# Patient Record
Sex: Male | Born: 1985
Health system: Southern US, Community
[De-identification: ages and names within clinical notes are randomized; demographics above are authoritative.]

## PROBLEM LIST (undated history)

## (undated) DIAGNOSIS — G709 Myoneural disorder, unspecified: Secondary | ICD-10-CM

---

## 2016-03-13 ENCOUNTER — Encounter (HOSPITAL_COMMUNITY): Payer: Self-pay | Admitting: *Deleted

## 2016-03-13 ENCOUNTER — Emergency Department (HOSPITAL_COMMUNITY)
Admission: EM | Admit: 2016-03-13 | Discharge: 2016-03-13 | Disposition: A | Discharge status: Home / Self Care | Payer: BLUE CROSS/BLUE SHIELD | Attending: Emergency Medicine | Admitting: Emergency Medicine

## 2016-03-13 DIAGNOSIS — Y939 Activity, unspecified: Secondary | ICD-10-CM | POA: Diagnosis not present

## 2016-03-13 DIAGNOSIS — M5442 Lumbago with sciatica, left side: Secondary | ICD-10-CM | POA: Insufficient documentation

## 2016-03-13 DIAGNOSIS — Y929 Unspecified place or not applicable: Secondary | ICD-10-CM | POA: Diagnosis not present

## 2016-03-13 DIAGNOSIS — M545 Low back pain: Secondary | ICD-10-CM | POA: Diagnosis present

## 2016-03-13 DIAGNOSIS — Z79899 Other long term (current) drug therapy: Secondary | ICD-10-CM | POA: Insufficient documentation

## 2016-03-13 DIAGNOSIS — X500XXA Overexertion from strenuous movement or load, initial encounter: Secondary | ICD-10-CM | POA: Insufficient documentation

## 2016-03-13 DIAGNOSIS — M62830 Muscle spasm of back: Secondary | ICD-10-CM | POA: Diagnosis not present

## 2016-03-13 DIAGNOSIS — Y999 Unspecified external cause status: Secondary | ICD-10-CM | POA: Insufficient documentation

## 2016-03-13 DIAGNOSIS — M5432 Sciatica, left side: Secondary | ICD-10-CM

## 2016-03-13 MED ORDER — CYCLOBENZAPRINE HCL 10 MG PO TABS: 10.0000 mg | ORAL_TABLET | Freq: Three times a day (TID) | ORAL | 0 refills | Status: DC | PRN

## 2016-03-13 MED ORDER — NAPROXEN 500 MG PO TABS: 500.0000 mg | ORAL_TABLET | Freq: Two times a day (BID) | ORAL | 0 refills | Status: DC

## 2016-03-13 MED ORDER — PREDNISONE 20 MG PO TABS: ORAL_TABLET | ORAL | 0 refills | Status: DC

## 2016-03-13 MED ORDER — KETOROLAC TROMETHAMINE 30 MG/ML IJ SOLN
60.0000 mg | Freq: Once | INTRAMUSCULAR | Status: AC
Start: 2016-03-13 — End: 2016-03-13
  Administered 2016-03-13: 60 mg via INTRAMUSCULAR
  Filled 2016-03-13: qty 2

## 2016-03-13 NOTE — ED Triage Notes (Signed)
Pt rts left back and leg pain that has been ongoing for 2 weeks. Pt reports seeing PCP with prescription given and no relief.

## 2016-03-13 NOTE — ED Notes (Signed)
Pt standing in room, states to back to sit down.  Pt began experiencing lower left side back pain and "like I pulled a hamstring" approx 12/19.  Has been seeing PMD, given muscle relaxers and percocet for pain and numbness to two lateral left toes.  States he is not improving and is unable to sleep, percocet is not helping.  Denies any other complaints.

## 2016-03-13 NOTE — Discharge Instructions (Signed)
Back Pain: Your back pain should be treated with medicines such as ibuprofen or aleve and this back pain should get better over the next 2 weeks.  However if you develop severe or worsening pain, low back pain with fever, numbness, weakness or inability to walk or urinate, you should return to the ER immediately.  Please follow up with your doctor this week for a recheck if still having symptoms.  Avoid heavy lifting over 10 pounds over the next two weeks.  Low back pain is discomfort in the lower back that may be due to injuries to muscles and ligaments around the spine.  Occasionally, it may be caused by a a problem to a part of the spine called a disc.  The pain may last several days or a week;  However, most patients get completely well in 4 weeks.  Self - care:  The application of heat can help soothe the pain.  Maintaining your daily activities, including walking, is encourged, as it will help you get better faster than just staying in bed. Perform gentle stretching as discussed. Drink plenty of fluids.  Medications are also useful to help with pain control.  A commonly prescribed medication includes over the counter tylenol as directed on the bottle.  Non steroidal anti inflammatory medications including Ibuprofen and naproxen;  These medications help both pain and swelling and are very useful in treating back pain.  They should be taken with food, as they can cause stomach upset, and more seriously, stomach bleeding.    Muscle relaxants:  These medications can help with muscle tightness that is a cause of lower back pain.  Most of these medications can cause drowsiness, and it is not safe to drive or use dangerous machinery while taking them.  Prednisone: this should help with the tingling you have in your leg. Take as directed, with breakfast.  SEEK IMMEDIATE MEDICAL ATTENTION IF: New numbness, tingling, weakness, or problem with the use of your arms or legs.  Severe back pain not relieved  with medications.  Difficulty with or loss of control of your bowel or bladder control.  Increasing pain in any areas of the body (such as chest or abdominal pain).  Shortness of breath, dizziness or fainting.  Nausea (feeling sick to your stomach), vomiting, fever, or sweats.  You will need to follow up with  Your primary healthcare provider in 1-2 weeks for reassessment.

## 2016-03-13 NOTE — ED Provider Notes (Signed)
[facility]-EMERGENCY DEPT Provider Note   CSN: [id number] Arrival date & time: 03/13/16  1259    By signing my name below, I, Jacob Wong, attest that this documentation has been prepared under the direction and in the presence of Marcos Ruelas Camprubi-Soms, PA-C. Electronically Signed: Valentino Wong, ED Scribe. 03/13/16. 2:17 PM.  History   Chief Complaint Chief Complaint  Patient presents with  . Back Pain   The history is provided by the patient and the spouse. No language interpreter was used.  Back Pain   This is a recurrent problem. The current episode started more than 1 week ago. The problem occurs daily. The problem has not changed since onset.The pain is associated with lifting heavy objects. The pain is present in the lumbar spine. Quality: sore. The pain radiates to the left thigh and left foot. The pain is at a severity of 6/10. The pain is moderate. The symptoms are aggravated by certain positions. The pain is the same all the time. Associated symptoms include numbness (paresthesias L pinky toe/posterior thigh), paresthesias (L thigh/foot) and tingling. Pertinent negatives include no chest pain, no fever, no abdominal pain, no bowel incontinence, no perianal numbness, no bladder incontinence, no dysuria, no paresis and no weakness. He has tried muscle relaxants and analgesics (prednisone, percocet, and flexeril) for the symptoms. The treatment provided mild relief.   HPI Comments: Jacob Wong is a 31 y.o. male who presents to the Emergency Department complaining of ongoing L lower back pain x1 month. He describes his pain as 6/10, intermittent, sore L lower back pain radiating into posterior L thigh and L foot, worse with movement, and with mild improvement after course of prednisone and flexeril 5mg  as well as percocet use. Associated symptoms includes mild tingling/paresthesias in his left pinky toe. Pt denies recent trauma, injury, falls, twisting, but states he does heavy lifting  and repetitive bending at work. Denies h/o cancer, IVDU, back surgery. Pt was seen at his PCP's office at [facility] in [place], [place] for this, which is when they prescribed the flexeril and prednisone first, then at his follow up visit he was given the percocet. Has not tried anything else for his symptoms. He denies bowel or bladder incontinence, saddle anesthesia/cauda equina symptoms, fever, chills, CP, SOB, abd pain, n/v/d/c, dysuria, hematuria, frequency, complete numbness, focal weakness, rashes, myalgias, arthralgias, or any other complaints at this time.    History reviewed. No pertinent past medical history.  There are no active problems to display for this patient.   History reviewed. No pertinent surgical history.     Home Medications    Prior to Admission medications   Medication Sig Start Date End Date Taking? Authorizing Provider  cyclobenzaprine (FLEXERIL) 10 MG tablet Take 1 tablet (10 mg total) by mouth 3 (three) times daily as needed for muscle spasms. 03/13/16   Mantaj Chamberlin Camprubi-Soms, PA-C  naproxen (NAPROSYN) 500 MG tablet Take 1 tablet (500 mg total) by mouth 2 (two) times daily with a meal. 03/13/16   Loys Shugars Camprubi-Soms, PA-C  predniSONE (DELTASONE) 20 MG tablet 3 tabs po day one, then 2 tabs daily x 4 days 03/13/16   Yan Okray Camprubi-Soms, PA-C    Family History No family history on file.  Social History Social History  Substance Use Topics  . Smoking status: Not on file  . Smokeless tobacco: Not on file  . Alcohol use Not on file     Allergies   Patient has no allergy information on record.   Review of  Systems Review of Systems  Constitutional: Negative for chills and fever.  Respiratory: Negative for shortness of breath.   Cardiovascular: Negative for chest pain.  Gastrointestinal: Negative for abdominal pain, bowel incontinence, constipation, diarrhea, nausea and vomiting.  Genitourinary: Negative for bladder incontinence,  difficulty urinating (no incontinence), dysuria and hematuria.  Musculoskeletal: Positive for back pain. Negative for arthralgias and myalgias.  Skin: Negative for color change.  Allergic/Immunologic: Negative for immunocompromised state.  Neurological: Positive for tingling, numbness (paresthesias L pinky toe/posterior thigh) and paresthesias (L thigh/foot). Negative for weakness.  Psychiatric/Behavioral: Negative for confusion.    10 Systems reviewed and all are negative for acute change except as noted in the HPI.  Physical Exam Updated Vital Signs BP 129/83 (BP Location: Left Arm)   Pulse 91   Temp 98.4 F (36.9 C) (Oral)   Resp 18   SpO2 99%   Physical Exam  Constitutional: He is oriented to person, place, and time. Vital signs are normal. He appears well-developed and well-nourished.  Non-toxic appearance. No distress.  Afebrile, nontoxic, NAD  HENT:  Head: Normocephalic and atraumatic.  Mouth/Throat: Mucous membranes are normal.  Eyes: Conjunctivae and EOM are normal. Right eye exhibits no discharge. Left eye exhibits no discharge.  Neck: Normal range of motion. Neck supple.  Cardiovascular: Normal rate and intact distal pulses.   Pulmonary/Chest: Effort normal. No respiratory distress.  Abdominal: Normal appearance. He exhibits no distension.  Musculoskeletal: Normal range of motion.       Lumbar back: He exhibits tenderness and spasm. He exhibits normal range of motion, no bony tenderness and no deformity.       Back:  Lumbar spine with FROM intact without spinous process TTP, no bony stepoffs or deformities, with mild left-sided paraspinous muscle TTP and muscle spasms. Strength and sensation grossly intact in all extremities, +SLR on left side, gait steady and nonantalgic. No overlying skin changes. Distal pulses intact.  Neurological: He is alert and oriented to person, place, and time. He has normal strength. No sensory deficit.  Skin: Skin is warm, dry and intact. No  rash noted.  Psychiatric: He has a normal mood and affect.  Nursing note and vitals reviewed.    ED Treatments / Results   DIAGNOSTIC STUDIES: Oxygen Saturation is 99% on RA, normal by my interpretation.    COORDINATION OF CARE: 2:07 PM Discussed treatment plan with pt at bedside which includes steroids, antiinflammatory, muscle relaxant and f/u with PCP and pt agreed to plan.   Labs (all labs ordered are listed, but only abnormal results are displayed) Labs Reviewed - No data to display  EKG  EKG Interpretation None       Radiology No results found.  Procedures Procedures (including critical care time)  Medications Ordered in ED Medications  ketorolac (TORADOL) 30 MG/ML injection 60 mg (60 mg Intramuscular Given 03/13/16 1420)     Initial Impression / Assessment and Plan / ED Course  I have reviewed the triage vital signs and the nursing notes.  Pertinent labs & imaging results that were available during my care of the patient were reviewed by me and considered in my medical decision making (see chart for details).  Clinical Course     31 y.o. male here with L lower back pain x1 month, does a lot of heavy lifting at work. No specific known injury. No red flag s/s of low back pain, no midline tenderness, no urinary complaints. No s/s of central cord compression or cauda equina. Lower extremities are  neurovascularly intact and patient is ambulating without difficulty. +SLR on L side, likely sciatica; c/o tingling in L pinky toe and posterior thigh which is consistent. Tenderness to L lumbar paraspinous muscles and SI joint area. Doubt need for emergent imaging at this time, but pt may warrant outpatient MRI if symptoms persist. Given that he improved with prednisone previously, will repeat this course but advised that this is likely a temporary band-aid and that he may still need to f/up with PCP for ongoing management and further eval/outpatient MRI.  Patient was  counseled on back pain precautions and told to do activity as tolerated but do not lift, push, or pull heavy objects more than 10 pounds for the next week. Patient counseled to use ice or heat on back for no longer than 15 minutes every hour.   Rx given for muscle relaxer and counseled on proper use of muscle relaxant medication. Urged patient not to drink alcohol, drive, or perform any other activities that requires focus while taking these medications. Rx given for naprosyn and prednisone. Discussed use of tylenol as well.  Patient urged to follow-up with PCP if pain does not improve with treatment and rest or if pain becomes recurrent. Urged to return with worsening severe pain, loss of bowel or bladder control, trouble walking. The patient verbalizes understanding and agrees with the plan.   I personally performed the services described in this documentation, which was scribed in my presence. The recorded information has been reviewed and is accurate.   Final Clinical Impressions(s) / ED Diagnoses   Final diagnoses:  Acute left-sided low back pain with left-sided sciatica  Muscle spasm of back  Sciatica of left side    New Prescriptions New Prescriptions   CYCLOBENZAPRINE (FLEXERIL) 10 MG TABLET    Take 1 tablet (10 mg total) by mouth 3 (three) times daily as needed for muscle spasms.   NAPROXEN (NAPROSYN) 500 MG TABLET    Take 1 tablet (500 mg total) by mouth 2 (two) times daily with a meal.   PREDNISONE (DELTASONE) 20 MG TABLET    3 tabs po day one, then 2 tabs daily x 4 days     [employer], PA-C 03/13/16 1426    Donnetta Hutching, MD 03/15/16 [phone]

## 2019-06-02 ENCOUNTER — Inpatient Hospital Stay (HOSPITAL_COMMUNITY)
Admission: EM | Admit: 2019-06-02 | Discharge: 2019-06-06 | DRG: 060 | Disposition: A | Discharge status: Home / Self Care | Payer: BC Managed Care – PPO | Attending: Student in an Organized Health Care Education/Training Program | Admitting: Student in an Organized Health Care Education/Training Program

## 2019-06-02 ENCOUNTER — Encounter (HOSPITAL_COMMUNITY): Payer: Self-pay | Admitting: Student

## 2019-06-02 ENCOUNTER — Inpatient Hospital Stay (HOSPITAL_COMMUNITY): Payer: BC Managed Care – PPO

## 2019-06-02 ENCOUNTER — Emergency Department (HOSPITAL_COMMUNITY): Payer: BC Managed Care – PPO

## 2019-06-02 ENCOUNTER — Other Ambulatory Visit: Payer: Self-pay

## 2019-06-02 DIAGNOSIS — R05 Cough: Secondary | ICD-10-CM

## 2019-06-02 DIAGNOSIS — R739 Hyperglycemia, unspecified: Secondary | ICD-10-CM | POA: Diagnosis not present

## 2019-06-02 DIAGNOSIS — G35 Multiple sclerosis: Secondary | ICD-10-CM | POA: Diagnosis not present

## 2019-06-02 DIAGNOSIS — R059 Cough, unspecified: Secondary | ICD-10-CM

## 2019-06-02 DIAGNOSIS — E559 Vitamin D deficiency, unspecified: Secondary | ICD-10-CM | POA: Diagnosis present

## 2019-06-02 DIAGNOSIS — Y92230 Patient room in hospital as the place of occurrence of the external cause: Secondary | ICD-10-CM | POA: Diagnosis not present

## 2019-06-02 DIAGNOSIS — M5031 Other cervical disc degeneration,  high cervical region: Secondary | ICD-10-CM | POA: Diagnosis not present

## 2019-06-02 DIAGNOSIS — R202 Paresthesia of skin: Secondary | ICD-10-CM | POA: Diagnosis not present

## 2019-06-02 DIAGNOSIS — R29818 Other symptoms and signs involving the nervous system: Secondary | ICD-10-CM | POA: Diagnosis not present

## 2019-06-02 DIAGNOSIS — M5124 Other intervertebral disc displacement, thoracic region: Secondary | ICD-10-CM | POA: Diagnosis not present

## 2019-06-02 DIAGNOSIS — G379 Demyelinating disease of central nervous system, unspecified: Secondary | ICD-10-CM | POA: Diagnosis not present

## 2019-06-02 DIAGNOSIS — Z03818 Encounter for observation for suspected exposure to other biological agents ruled out: Secondary | ICD-10-CM | POA: Diagnosis not present

## 2019-06-02 DIAGNOSIS — Z79899 Other long term (current) drug therapy: Secondary | ICD-10-CM

## 2019-06-02 DIAGNOSIS — Z880 Allergy status to penicillin: Secondary | ICD-10-CM | POA: Diagnosis not present

## 2019-06-02 DIAGNOSIS — Z8249 Family history of ischemic heart disease and other diseases of the circulatory system: Secondary | ICD-10-CM

## 2019-06-02 DIAGNOSIS — T380X5A Adverse effect of glucocorticoids and synthetic analogues, initial encounter: Secondary | ICD-10-CM | POA: Diagnosis not present

## 2019-06-02 DIAGNOSIS — M79602 Pain in left arm: Secondary | ICD-10-CM | POA: Diagnosis not present

## 2019-06-02 DIAGNOSIS — Z20822 Contact with and (suspected) exposure to covid-19: Secondary | ICD-10-CM | POA: Diagnosis not present

## 2019-06-02 DIAGNOSIS — Z881 Allergy status to other antibiotic agents status: Secondary | ICD-10-CM

## 2019-06-02 DIAGNOSIS — K219 Gastro-esophageal reflux disease without esophagitis: Secondary | ICD-10-CM

## 2019-06-02 DIAGNOSIS — R200 Anesthesia of skin: Secondary | ICD-10-CM | POA: Diagnosis not present

## 2019-06-02 DIAGNOSIS — R519 Headache, unspecified: Secondary | ICD-10-CM | POA: Diagnosis not present

## 2019-06-02 LAB — BASIC METABOLIC PANEL
Anion gap: 9 (ref 5–15)
BUN: 11 mg/dL (ref 6–20)
CO2: 28 mmol/L (ref 22–32)
Calcium: 9.4 mg/dL (ref 8.9–10.3)
Chloride: 102 mmol/L (ref 98–111)
Creatinine, Ser: 0.85 mg/dL (ref 0.61–1.24)
GFR calc Af Amer: >60 mL/min (ref >60)
GFR calc non Af Amer: >60 mL/min (ref >60)
Glucose, Bld: 100 mg/dL — ABNORMAL HIGH (ref 70–99)
Potassium: 4 mmol/L (ref 3.5–5.1)
Sodium: 139 mmol/L (ref 135–145)

## 2019-06-02 LAB — CBC
HCT: 46.5 % (ref 39.0–52.0)
Hemoglobin: 15.2 g/dL (ref 13.0–17.0)
MCH: 31 pg (ref 26.0–34.0)
MCHC: 32.7 g/dL (ref 30.0–36.0)
MCV: 94.9 fL (ref 80.0–100.0)
Platelets: 216 10*3/uL (ref 150–400)
RBC: 4.9 MIL/uL (ref 4.22–5.81)
RDW: 11.9 % (ref 11.5–15.5)
WBC: 5.5 10*3/uL (ref 4.0–10.5)
nRBC: 0 % (ref 0.0–0.2)

## 2019-06-02 LAB — URINALYSIS, ROUTINE W REFLEX MICROSCOPIC
Bilirubin Urine: NEGATIVE
Glucose, UA: NEGATIVE mg/dL
Hgb urine dipstick: NEGATIVE
Ketones, ur: NEGATIVE mg/dL
Leukocytes,Ua: NEGATIVE
Nitrite: NEGATIVE
Protein, ur: NEGATIVE mg/dL
Specific Gravity, Urine: 1.01 (ref 1.005–1.030)
pH: 6 (ref 5.0–8.0)

## 2019-06-02 LAB — SARS CORONAVIRUS 2 (TAT 6-24 HRS): SARS Coronavirus 2: NEGATIVE

## 2019-06-02 LAB — HEMOGLOBIN A1C
Hgb A1c MFr Bld: 4.9 % (ref 4.8–5.6)
Mean Plasma Glucose: 93.93 mg/dL

## 2019-06-02 LAB — GLUCOSE, CAPILLARY: Glucose-Capillary: 160 mg/dL — ABNORMAL HIGH (ref 70–99)

## 2019-06-02 LAB — MAGNESIUM: Magnesium: 1.9 mg/dL (ref 1.7–2.4)

## 2019-06-02 MED ORDER — ACETAMINOPHEN 650 MG RE SUPP: 650.0000 mg | Freq: Four times a day (QID) | RECTAL | Status: DC | PRN

## 2019-06-02 MED ORDER — LIDOCAINE HCL (PF) 1 % IJ SOLN
INTRAMUSCULAR | Status: AC
  Filled 2019-06-02: qty 30

## 2019-06-02 MED ORDER — ACETAMINOPHEN 325 MG PO TABS
650.0000 mg | ORAL_TABLET | Freq: Four times a day (QID) | ORAL | Status: DC | PRN
  Administered 2019-06-03 – 2019-06-05 (×3): 650 mg via ORAL
  Filled 2019-06-02 (×3): qty 2

## 2019-06-02 MED ORDER — DIAZEPAM 5 MG PO TABS
5.0000 mg | ORAL_TABLET | Freq: Once | ORAL | Status: AC
  Administered 2019-06-02: 5 mg via ORAL
  Filled 2019-06-02: qty 1

## 2019-06-02 MED ORDER — INSULIN ASPART 100 UNIT/ML ~~LOC~~ SOLN
0.0000 [IU] | Freq: Three times a day (TID) | SUBCUTANEOUS | Status: DC
  Administered 2019-06-03: 2 [IU] via SUBCUTANEOUS
  Administered 2019-06-03: 1 [IU] via SUBCUTANEOUS
  Administered 2019-06-03 – 2019-06-04 (×3): 2 [IU] via SUBCUTANEOUS
  Administered 2019-06-04 – 2019-06-05 (×2): 1 [IU] via SUBCUTANEOUS

## 2019-06-02 MED ORDER — GADOBUTROL 1 MMOL/ML IV SOLN
10.0000 mL | Freq: Once | INTRAVENOUS | Status: AC | PRN
  Administered 2019-06-02: 10 mL via INTRAVENOUS

## 2019-06-02 MED ORDER — SODIUM CHLORIDE 0.9 % IV SOLN
1000.0000 mg | Freq: Every day | INTRAVENOUS | Status: AC
  Administered 2019-06-02 – 2019-06-06 (×5): 1000 mg via INTRAVENOUS
  Filled 2019-06-02 (×5): qty 8

## 2019-06-02 MED ORDER — PANTOPRAZOLE SODIUM 40 MG IV SOLR
40.0000 mg | INTRAVENOUS | Status: DC
  Administered 2019-06-02 – 2019-06-03 (×2): 40 mg via INTRAVENOUS
  Filled 2019-06-02 (×2): qty 40

## 2019-06-02 NOTE — ED Notes (Signed)
Pt taken to MRI  

## 2019-06-02 NOTE — ED Triage Notes (Signed)
Pt here with c/o left arm pain and left leg pain , tingling , history same , will come and go , no trauma noted

## 2019-06-02 NOTE — ED Provider Notes (Signed)
[facility] EMERGENCY DEPARTMENT Provider Note   CSN: [id number] Arrival date & time: 06/02/19  3710     History Chief Complaint  Patient presents with  . Arm Pain  . Leg Pain    Jacob Wong is a 34 y.o. male without significant past medical hx who presents to the ED with complaints of LUE/LLE paresthesias & cramping intermittently x 4 days. Patient states that he gets a very light tingling sensation from the L shoulder distally and from the L hip distally with associated cramping sensation in the L hand and the L foot. He states that sxs occur with transition from sitting to standing and with certain positions/movements. No alleviating factors. Duration of episodes is 20-40 seconds. States pain of cramping in hand/foot are intense, but otherwise not having much pain. No specific injuries or change in activity he can recall. Denies weakness, saddle anesthesia, incontinence/retention of bowel/bladder, fever, chills, IV drug use, dysuria, or hx of cancer. Denies visual disturbance, dizziness, headache, syncope, seizures, facial asymmetry, or speech difficulty.    HPI     No past medical history on file.  There are no problems to display for this patient.   No past surgical history on file.     No family history on file.  Social History   Tobacco Use  . Smoking status: Not on file  Substance Use Topics  . Alcohol use: Not on file  . Drug use: Not on file    Home Medications Prior to Admission medications   Medication Sig Start Date End Date Taking? Authorizing Provider  cyclobenzaprine (FLEXERIL) 10 MG tablet Take 1 tablet (10 mg total) by mouth 3 (three) times daily as needed for muscle spasms. 03/13/16   Street, [place], PA-C  naproxen (NAPROSYN) 500 MG tablet Take 1 tablet (500 mg total) by mouth 2 (two) times daily with a meal. 03/13/16   Street, [place], PA-C  predniSONE (DELTASONE) 20 MG tablet 3 tabs po day one, then 2 tabs daily x 4 days 03/13/16    Street, [place], [place]    Allergies    Patient has no allergy information on record.  Review of Systems   Review of Systems  Constitutional: Negative for chills, fever and unexpected weight change.  Respiratory: Negative for shortness of breath.   Cardiovascular: Negative for chest pain.  Gastrointestinal: Negative for abdominal pain, nausea and vomiting.  Genitourinary: Negative for dysuria.  Musculoskeletal: Positive for myalgias.  Neurological: Negative for dizziness, seizures, syncope, facial asymmetry, speech difficulty, weakness and light-headedness.       Positive for intermittent LUE/LLE paresthesias. Negative for saddle anesthesia or bowel/bladder incontinence.   All other systems reviewed and are negative.  Physical Exam Updated Vital Signs BP (!) 149/89 (BP Location: Right Arm)   Pulse 86   Temp 98.3 F (36.8 C) (Oral)   Resp 18   Ht 6\' 2"  (1.88 m)   Wt 122.5 kg   SpO2 100%   BMI 34.67 kg/m   Physical Exam Vitals and nursing note reviewed.  Constitutional:      General: He is not in acute distress.    Appearance: He is well-developed. He is not toxic-appearing.  HENT:     Head: Normocephalic and atraumatic.  Eyes:     General:        Right eye: No discharge.        Left eye: No discharge.     Conjunctiva/sclera: Conjunctivae normal.  Cardiovascular:     Rate and Rhythm: Normal  rate and regular rhythm.     Pulses:          Radial pulses are 2+ on the right side and 2+ on the left side.       Dorsalis pedis pulses are 2+ on the right side and 2+ on the left side.       Posterior tibial pulses are 2+ on the right side and 2+ on the left side.  Pulmonary:     Effort: Pulmonary effort is normal. No respiratory distress.     Breath sounds: Normal breath sounds. No wheezing, rhonchi or rales.  Abdominal:     General: There is no distension.     Palpations: Abdomen is soft.     Tenderness: There is no abdominal tenderness.  Musculoskeletal:     Cervical  back: Normal range of motion and neck supple. No spinous process tenderness.     Comments: No obvious deformity, erythema, warmth, ecchymosis, or open wounds.  UEs/LEs: Intact AROM throughout. No point/focal bony tenderness. Compartments are soft.  Back: No point/focal vertebral tenderness or palpable step off.   Skin:    General: Skin is warm and dry.     Findings: No rash.  Neurological:     Mental Status: He is alert.     Comments: Clear speech. CN III-XII grossly intact. Sensation grossly intact to bilateral upper and lower extremities. 5/5 symmetric grip strength & strength with plantar/dorsiflexion bilaterally. Ambulatory  Psychiatric:        Behavior: Behavior normal.    ED Results / Procedures / Treatments   Labs (all labs ordered are listed, but only abnormal results are displayed) Labs Reviewed - No data to display  EKG None  Radiology MR Brain W and Wo Contrast  Result Date: 06/02/2019 CLINICAL DATA:  Intermittent left upper extremity/left lower extremity paresthesias; numbness or tingling, paresthesias. Additional provided: Patient reports left arm pain and left leg pain, tingling. EXAM: MRI HEAD WITHOUT AND WITH CONTRAST MRI CERVICAL SPINE WITHOUT AND WITH CONTRAST TECHNIQUE: Multiplanar, multiecho pulse sequences of the brain and surrounding structures, and cervical spine, to include the craniocervical junction and cervicothoracic junction, were obtained without and with intravenous contrast. CONTRAST:  75mL GADAVIST GADOBUTROL 1 MMOL/ML IV SOLN COMPARISON:  No pertinent prior studies available for comparison. FINDINGS: MRI HEAD FINDINGS Brain: Susceptibility artifact from orthodontic hardware obscures anterior portions of the intracranial contents on multiple sequences, most notably on the diffusion-weighted and SWI sequences. Additionally, portions of the posterior fossa are obscured on several sequences. There are moderate scattered multifocal T2/FLAIR hyperintense signal  changes within the subcortical/juxtacortical as well as deep periventricular white matter. The largest lesions are located within the left frontal lobe (10 mm on image 14 of series 11), within the right periatrial white matter (10 mm on image 14 of series 11) and within the right thalamocapsular junction (8 mm on image 13 of series 11). Several of these lesions demonstrate corresponding restricted diffusion and/or abnormal enhancement. There is enhancement of the lesion within the right thalamocapsular junction (series 33, image 13). There is also enhancement at sites of small lesions within the right frontal lobe (series 32, image 36) and left frontoparietal lobe (series 32, image 36), as well as within the anterior left temporal lobe (series 32, image 22). Several small lesions along the margin of the posterior left lateral ventricle demonstrate restricted diffusion (series 5, image 78) (series 5, image 76) (series 5, image 74). Additional lesion showing restricted diffusion within the high right frontal lobe (series  5, image 85). There is no evidence of an intracranial mass. No midline shift or extra-axial fluid collection. No chronic intracranial blood products are identified within described limitations. Vascular: Flow voids maintained within the proximal large arterial vessels. Skull and upper cervical spine: No focal marrow lesion. Sinuses/Orbits: Visualized orbits demonstrate no acute abnormality. Mild right maxillary sinus mucosal thickening. No significant mastoid effusion. MRI CERVICAL SPINE FINDINGS Alignment: Straightening of the expected cervical lordosis. No significant spondylolisthesis. Vertebrae: Vertebral body height is maintained. Schmorl node within the C3 inferior endplate. Mixed degenerative endplate marrow signal at C3-C4. This includes moderate degenerative endplate edema as well as degenerative endplate enhancement at C3-C4. Cord: No spinal cord signal abnormality or abnormal cord  enhancement. Posterior Fossa, vertebral arteries, paraspinal tissues: Posterior fossa better assessed on concurrently performed brain MRI. Flow voids preserved within the imaged cervical vertebral arteries. Paraspinal soft tissues within normal limits. Disc levels: Mild-to-moderate C3-C4 disc degeneration. No more than mild disc degeneration at the remaining levels. C2-C3: No disc herniation. No significant canal or foraminal stenosis. C3-C4: No significant disc herniation or spinal canal stenosis. Left-sided disc osteophyte ridge/uncinate hypertrophy with resultant mild to moderate left neural foraminal narrowing. C4-C5: No disc herniation. No significant canal or foraminal stenosis. C5-C6: No disc herniation. No significant canal or foraminal stenosis. C6-C7: No disc herniation. No significant canal or foraminal stenosis. C7-T1: No disc herniation. No significant canal or foraminal stenosis. IMPRESSION: MRI brain: 1. Examination limited by susceptibility artifact arising from orthodontic hardware. 2. Multifocal T2 hyperintense signal changes within the cerebral white matter, several of which demonstrate corresponding restricted diffusion and enhancement. This includes an 8 mm enhancing lesion within the right thalamocapsular junction which likely explains the patient's reported symptoms. Findings are not entirely specific but highly suggestive of demyelinating disease with active demyelination. MRI cervical spine: 1. No cervical spinal cord signal abnormality or abnormal cord enhancement. 2. At C3-C4, there is mild-to-moderate disc degeneration with C3 inferior endplate Schmorl node. Degenerative endplate edema and enhancement. Left-sided disc osteophyte ridge/uncinate hypertrophy contributes to mild/moderate left neural foraminal narrowing. 3. No significant disc herniation, spinal canal stenosis or neural foraminal narrowing at the remaining levels. Electronically Signed   By: Jackey Loge DO   On: 06/02/2019  14:42   MR Cervical Spine W or Wo Contrast  Result Date: 06/02/2019 CLINICAL DATA:  Intermittent left upper extremity/left lower extremity paresthesias; numbness or tingling, paresthesias. Additional provided: Patient reports left arm pain and left leg pain, tingling. EXAM: MRI HEAD WITHOUT AND WITH CONTRAST MRI CERVICAL SPINE WITHOUT AND WITH CONTRAST TECHNIQUE: Multiplanar, multiecho pulse sequences of the brain and surrounding structures, and cervical spine, to include the craniocervical junction and cervicothoracic junction, were obtained without and with intravenous contrast. CONTRAST:  10mL GADAVIST GADOBUTROL 1 MMOL/ML IV SOLN COMPARISON:  No pertinent prior studies available for comparison. FINDINGS: MRI HEAD FINDINGS Brain: Susceptibility artifact from orthodontic hardware obscures anterior portions of the intracranial contents on multiple sequences, most notably on the diffusion-weighted and SWI sequences. Additionally, portions of the posterior fossa are obscured on several sequences. There are moderate scattered multifocal T2/FLAIR hyperintense signal changes within the subcortical/juxtacortical as well as deep periventricular white matter. The largest lesions are located within the left frontal lobe (10 mm on image 14 of series 11), within the right periatrial white matter (10 mm on image 14 of series 11) and within the right thalamocapsular junction (8 mm on image 13 of series 11). Several of these lesions demonstrate corresponding restricted diffusion and/or abnormal enhancement. There  is enhancement of the lesion within the right thalamocapsular junction (series 33, image 13). There is also enhancement at sites of small lesions within the right frontal lobe (series 32, image 36) and left frontoparietal lobe (series 32, image 36), as well as within the anterior left temporal lobe (series 32, image 22). Several small lesions along the margin of the posterior left lateral ventricle demonstrate  restricted diffusion (series 5, image 78) (series 5, image 76) (series 5, image 74). Additional lesion showing restricted diffusion within the high right frontal lobe (series 5, image 85). There is no evidence of an intracranial mass. No midline shift or extra-axial fluid collection. No chronic intracranial blood products are identified within described limitations. Vascular: Flow voids maintained within the proximal large arterial vessels. Skull and upper cervical spine: No focal marrow lesion. Sinuses/Orbits: Visualized orbits demonstrate no acute abnormality. Mild right maxillary sinus mucosal thickening. No significant mastoid effusion. MRI CERVICAL SPINE FINDINGS Alignment: Straightening of the expected cervical lordosis. No significant spondylolisthesis. Vertebrae: Vertebral body height is maintained. Schmorl node within the C3 inferior endplate. Mixed degenerative endplate marrow signal at C3-C4. This includes moderate degenerative endplate edema as well as degenerative endplate enhancement at C3-C4. Cord: No spinal cord signal abnormality or abnormal cord enhancement. Posterior Fossa, vertebral arteries, paraspinal tissues: Posterior fossa better assessed on concurrently performed brain MRI. Flow voids preserved within the imaged cervical vertebral arteries. Paraspinal soft tissues within normal limits. Disc levels: Mild-to-moderate C3-C4 disc degeneration. No more than mild disc degeneration at the remaining levels. C2-C3: No disc herniation. No significant canal or foraminal stenosis. C3-C4: No significant disc herniation or spinal canal stenosis. Left-sided disc osteophyte ridge/uncinate hypertrophy with resultant mild to moderate left neural foraminal narrowing. C4-C5: No disc herniation. No significant canal or foraminal stenosis. C5-C6: No disc herniation. No significant canal or foraminal stenosis. C6-C7: No disc herniation. No significant canal or foraminal stenosis. C7-T1: No disc herniation. No  significant canal or foraminal stenosis. IMPRESSION: MRI brain: 1. Examination limited by susceptibility artifact arising from orthodontic hardware. 2. Multifocal T2 hyperintense signal changes within the cerebral white matter, several of which demonstrate corresponding restricted diffusion and enhancement. This includes an 8 mm enhancing lesion within the right thalamocapsular junction which likely explains the patient's reported symptoms. Findings are not entirely specific but highly suggestive of demyelinating disease with active demyelination. MRI cervical spine: 1. No cervical spinal cord signal abnormality or abnormal cord enhancement. 2. At C3-C4, there is mild-to-moderate disc degeneration with C3 inferior endplate Schmorl node. Degenerative endplate edema and enhancement. Left-sided disc osteophyte ridge/uncinate hypertrophy contributes to mild/moderate left neural foraminal narrowing. 3. No significant disc herniation, spinal canal stenosis or neural foraminal narrowing at the remaining levels. Electronically Signed   By: Kellie Simmering DO   On: 06/02/2019 14:42    Procedures .Lumbar Puncture  Date/Time: 06/02/2019 4:39 PM Performed by: Amaryllis Dyke, PA-C Authorized by: Amaryllis Dyke, PA-C   Consent:    Consent obtained:  Written   Consent given by:  Patient   Risks discussed:  Bleeding, headache, nerve damage, infection, pain and repeat procedure   Alternatives discussed:  No treatment and alternative treatment Universal protocol:    Procedure explained and questions answered to patient or proxy's satisfaction: yes     Relevant documents present and verified: yes     Test results available and properly labeled: yes     Imaging studies available: yes     Required blood products, implants, devices, and special equipment available: yes  Immediately prior to procedure a time out was called: yes     Site/side marked: yes     Patient identity confirmed:  Verbally with  patient Pre-procedure details:    Procedure purpose:  Diagnostic   Preparation: Patient was prepped and draped in usual sterile fashion   Anesthesia (see MAR for exact dosages):    Anesthesia method:  Local infiltration   Local anesthetic:  Lidocaine 1% w/o epi Procedure details:    Lumbar space:  L4-L5 interspace   Patient position:  L lateral decubitus   Ultrasound guidance: no     Number of attempts:  3 Post-procedure:    Puncture site:  Adhesive bandage applied and direct pressure applied   Patient tolerance of procedure:  Tolerated well, no immediate complications Comments:     Procedure performed with supervising physician Dr. Pilar Plate, ultimately unsuccessful.    (including critical care time)  Medications Ordered in ED Medications - No data to display  ED Course  I have reviewed the triage vital signs and the nursing notes.  Pertinent labs & imaging results that were available during my care of the patient were reviewed by me and considered in my medical decision making (see chart for details).    MDM Rules/Calculators/A&P                      Patient presents to the ED with intermittent LUE/LLE paresthesias with hand/foot cramping for the past few days.  Nontoxic-appearing, resting comfortably, vitals without significant abnormality, BP mildly elevated.  Patient has a benign physical exam without focal neurologic deficits.  Differential diagnosis includes: Muscle spasms, peripheral neuropathy, spinal stenosis, mass, electrolyte derangement, multiple sclerosis, mass, CVA.  We will proceed with labs and MRI of the brain and cervical spine with and without contrast per discussion with supervising physician Dr. Pilar Plate.  Labs reviewed and interpreted: Generally unremarkable, no significant electrolyte derangement or anemia.  MRI brain: With multifocal T2 hyperintense signal changes within the cerebral white matter, findings not entirely specific but highly suggestive of  demyelinating disease with active demyelination. MRI cervical spine:  No cervical spinal cord signal abnormality or abnormal cord enhancement. Additional findings as above.   15:15: CONSULT: Discussed with neurologist Dr. Wilford Corner who has reviewed patient's MRI, concerning for MS, recommends LP to further assess (cell count, glucose, protein, gram stain, oligoclonal bands, & IgG index in CSF). Will see patient in consultation and place further orders if he his amenable to staying in the hospital.   After multiple discussions with patient & family- ultimately in agreement with admission & LP LP attempted with supervising physician Dr. Pilar Plate, unsuccessful, Dr. Wilford Corner @ bedside, will consult for image guided LP. Consult placed to hospitalist service.   17:00: CONSULT: Discussed with internal medicine teaching service- accepts admission.   Jacob Wong was evaluated in Emergency Department on 06/02/2019 for the symptoms described in the history of present illness. He/she was evaluated in the context of the global COVID-19 pandemic, which necessitated consideration that the patient might be at risk for infection with the SARS-CoV-2 virus that causes COVID-19. Institutional protocols and algorithms that pertain to the evaluation of patients at risk for COVID-19 are in a state of rapid change based on information released by regulatory bodies including the CDC and federal and state organizations. These policies and algorithms were followed during the patient's care in the ED.  Final Clinical Impression(s) / ED Diagnoses Final diagnoses:  Demyelinating disease (HCC)    Rx / DC  Orders ED Discharge Orders    None       Cherly Anderson, PA-C 06/02/19 1703    Sabas Sous, MD 06/03/19 [phone]

## 2019-06-02 NOTE — Consult Note (Addendum)
Neurology Consultation  Reason for Consult: Likely MS Referring Physician: Sabas Sous, MD  CC: Tingling on left side arm and leg  History is obtained from: Patient  HPI: Jacob Wong is a 34 y.o. male with no pertinent past medical history.  Patient initially came to the ED secondary to tingling, numbness of the left arm and leg that was intermittent.  MRI showed T2 hyperintensities which are highly suggestive of demyelinating disease.  Neurology was asked to see patient for this reason.  Patient states that on Friday he noted that he had tingling sensations along his left arm and leg.  He also had cramping of his left arm and leg.  He noted that this was intermittent and would come and go.  He noted that it would be worse when he actually laid on the left side.  Patient was unsure of what to make of this thus he waited to come to the ED.  Due to the symptoms not subsiding he traveled to the ED today to get further evaluation.  MRI of the brain showed as above.  Currently patient does not have any symptoms.  Patient denies any other neurological symptoms.  He does not know of any multiple sclerosis or demyelinating diseases that run in his family.  ED course  MRI to evaluate for lesions or stroke LP to evaluate for CSF labs.  LP was performed but was unable to obtain any CSF fluid.  Patient does not have any pertinent past medical history.  Family History  Problem Relation Age of Onset  . Hypertension Mother   . Hypertension Father    Social History:   has no history on file for tobacco, alcohol, and drug.  Medications  Current Facility-Administered Medications:  .  lidocaine (PF) (XYLOCAINE) 1 % injection, , , ,  No current outpatient medications on file.  ROS:    General ROS: negative for - chills, fatigue, fever, night sweats, weight gain or weight loss Psychological ROS: negative for - behavioral disorder, hallucinations, memory difficulties, mood swings or suicidal  ideation Ophthalmic ROS: negative for - blurry vision, double vision, eye pain or loss of vision ENT ROS: negative for - epistaxis, nasal discharge, oral lesions, sore throat, tinnitus or vertigo Allergy and Immunology ROS: negative for - hives or itchy/watery eyes Hematological and Lymphatic ROS: negative for - bleeding problems, bruising or swollen lymph nodes Endocrine ROS: negative for - galactorrhea, hair pattern changes, polydipsia/polyuria or temperature intolerance Respiratory ROS: negative for - cough, hemoptysis, shortness of breath or wheezing Cardiovascular ROS: negative for - chest pain, dyspnea on exertion, edema or irregular heartbeat Gastrointestinal ROS: negative for - abdominal pain, diarrhea, hematemesis, nausea/vomiting or stool incontinence Genito-Urinary ROS: negative for - dysuria, hematuria, incontinence or urinary frequency/urgency Musculoskeletal ROS: negative for - joint swelling or muscular weakness Neurological ROS: as noted in HPI Dermatological ROS: negative for rash and skin lesion changes  Exam: Current vital signs: BP (!) 149/89 (BP Location: Right Arm)   Pulse 86   Temp 98.3 F (36.8 C) (Oral)   Resp 18   Ht 6\' 2"  (1.88 m)   Wt 122.5 kg   SpO2 100%   BMI 34.67 kg/m  Vital signs in last 24 hours: Temp:  [98.3 F (36.8 C)] 98.3 F (36.8 C) (03/22 1004) Pulse Rate:  [86] 86 (03/22 1004) Resp:  [18] 18 (03/22 1004) BP: (149)/(89) 149/89 (03/22 1004) SpO2:  [100 %] 100 % (03/22 1004) Weight:  [122.5 kg] 122.5 kg (03/22 1004)  Constitutional: Appears well-developed and well-nourished.  Psych: Affect appropriate to situation Eyes: No scleral injection HENT: No OP obstrucion Head: Normocephalic.  Cardiovascular: Normal rate and regular rhythm.  Respiratory: Effort normal, non-labored breathing GI: Soft.  No distension. There is no tenderness.  Skin: WDI Neuro: Mental Status: Patient is awake, alert, oriented to person, place, month, year,  and situation. Speech-intact naming, repeating, comprehension.  No aphasia noted.  No dysarthria noted. Patient is able to give a clear and coherent history. Cranial Nerves: II: Visual Fields are full.  III,IV, VI: EOMI without ptosis or diploplia. Pupils equal, round and reactive to light V: Facial sensation is symmetric to temperature VII: Facial movement is symmetric.  VIII: hearing is intact to voice X: Palat elevates symmetrically XI: Shoulder shrug is symmetric. XII: tongue is midline without atrophy or fasciculations.  Motor: Tone is normal. Bulk is normal. 5/5 strength was present in all four extremities.  No drift Sensory: Sensation is symmetric to light touch and temperature in the arms and legs.  Patient does have stocking distribution light touch and temperature DSS intact Deep Tendon Reflexes: 2+ and symmetric in the biceps and patellae.   Plantars: Toes are downgoing bilaterally.  Cerebellar: FNF and HKS are intact bilaterally  Labs I have reviewed labs in epic and the results pertinent to this consultation are:   CBC    Component Value Date/Time   WBC 5.5 06/02/2019 1144   RBC 4.90 06/02/2019 1144   HGB 15.2 06/02/2019 1144   HCT 46.5 06/02/2019 1144   PLT 216 06/02/2019 1144   MCV 94.9 06/02/2019 1144   MCH 31.0 06/02/2019 1144   MCHC 32.7 06/02/2019 1144   RDW 11.9 06/02/2019 1144    CMP     Component Value Date/Time   NA 139 06/02/2019 1144   K 4.0 06/02/2019 1144   CL 102 06/02/2019 1144   CO2 28 06/02/2019 1144   GLUCOSE 100 (H) 06/02/2019 1144   BUN 11 06/02/2019 1144   CREATININE 0.85 06/02/2019 1144   CALCIUM 9.4 06/02/2019 1144   GFRNONAA >60 06/02/2019 1144   GFRAA >60 06/02/2019 1144    Lipid Panel  No results found for: CHOL, TRIG, HDL, CHOLHDL, VLDL, LDLCALC, LDLDIRECT   Imaging I have reviewed the images obtained:  MRI examination of the brain- 1. Examination limited by susceptibility artifact arising from orthodontic  hardware. 2. Multifocal T2 hyperintense signal changes within the cerebral white matter, several of which demonstrate corresponding restricted diffusion and enhancement. This includes an 8 mm enhancing lesion within the right thalamocapsular junction which likely explains the patient's reported symptoms. Findings are not entirely specific but highly suggestive of demyelinating disease with active demyelination.  MRI of cervical spine-no cervical spine cord signal abnormality or abnormality in the cord.  At C3-C4 there is mild to moderate disc degeneration with C3 inferior endplate Schmorl's node.  Felicie Morn PA-C [facility] [phone]  M-F  (9:00 am- 5:00 PM)  06/02/2019, 4:59 PM   Assessment:  This is a 34 year old male presenting to the ED with intermittent left sided numbness, tingling and cramping over the last 3 days.  MRI reveals T2 hyperintensities highly suggestive of demyelinating disease.  On exam patient currently is having no symptoms and has no localizing lateralizing symptoms.  Given the multiple lesions this most likely does represent multiple sclerosis, however patient will need to obtain LP under fluoroscopy for further evaluation as there is no speech spine or posterior fossa involvement to make a definitive diagnosis just  about yet.  Attempted assisting EDP with LP - failed after multiple bedside attempts.  Impression: T2 hyperintensities very suggestive of demyelinating disease-top of the differential multiple cirrhosis.  Also to consider other differentials such as neuromyelitis optica but there is no evidence of spinal cord involvement on the C-spine MRI making it less likely.  Recommendations: -Solu-Medrol 1 g IV daily for 5 days -Check UA and chest x-ray to make sure there is no evidence of active infection. -40 mg Protonix daily for GI protection -MRI of thoracic spine with and without contrast tomorrow -Check neuromyelitis optica  antibodies -Fluoroscopy guided spinal tap-along with glucose, protein, cell count, Gram stain sent for IgG index and oligoclonal bands. -Patient to follow-up with Dr. Felecia Shelling MS specialist as an outpatient for further evaluation and recommendations.  Attending Neurohospitalist Addendum Patient seen and examined with APP/Resident. Agree with the history and physical as documented above. Agree with the plan as documented, which I helped formulate. I have independently reviewed the chart, obtained history, review of systems and examined the patient.I have personally reviewed pertinent head/neck/spine imaging (CT/MRI). Please feel free to call with any questions. --- Amie Portland, MD [facility] Pager: [phone] If 7pm to 7am, please call on call as listed on AMION.

## 2019-06-02 NOTE — H&P (Addendum)
Date: 06/02/2019               Patient Name:  Jacob Wong MRN: [id number]  DOB: [birth year] Age / Sex: 34 y.o., male   PCP: Patient, No Pcp Per         Medical Service: Internal Medicine Teaching Service         Attending Physician: Dr. Pilar Plate, Elmer Sow, MD    First Contact: Marchia Bond, DO, Sharlet Salina Pager: [facility] [phone])  Second Contact: Dortha Schwalbe, MD, [place] Pager: [facility] [phone])       After Hours (After 5p/  First Contact Pager: [phone]  weekends / holidays): Second Contact Pager: [phone]   Chief Complaint: Left-sided paresthesias  History of Present Illness: Jacob Wong is a 34 year old male with no significant past medical history who presented to [facility] ED with a 4-day history of left-sided paresthesias with associated intermittent cramping.  Patient states that he was in his normal state of health until Friday when he started noticing tingling in his left arm and leg with associated cramping.  The sensation was intermittent and would be exacerbated by sudden movements such as standing up or sitting down or moving from side to side.  He states that his symptoms persist to the point where it would interfere with his work decided to come to the ED for further evaluation.  He denies any headache, change in vision, or numbness.  He denies recent travel, recent infection, sick contacts, changes in medications, illicit drug use.  He denies any fevers, shortness of breath, cough, chest pain, abdominal pain, problems with bowel or bladder function.  ED Course: ED course was significant for an MRI showing Multifocal T2 hyperintense signal changes within the cerebral white matte.  LP was attempted by ED physician and neurologist without success.  Lab Orders     CSF culture     Gram stain     SARS CORONAVIRUS 2 (TAT 6-24 HRS) Nasopharyngeal Nasopharyngeal Swab     CBC     Basic metabolic panel     Magnesium     CSF cell count with differential collection tube #: 1     CSF cell count  with differential collection tube #: 4     Glucose, CSF     Protein, CSF     Oligoclonal bands, CSF + serum     Draw extra clot tube     IgG CSF index     Draw extra clot tube   Meds:  None  Social:  Patient lives in [place] with his fiance and 4 kids Patient is employed locally He denies alcohol, tobacco use, illicit drug use.  Family History:  Family History  Problem Relation Age of Onset   Hypertension Mother    Hypertension Father      Allergies: Allergies as of 06/02/2019 - Review Complete 06/02/2019  Allergen Reaction Noted   Amoxicillin Rash 06/02/2019   History reviewed. No pertinent past medical history.   Review of Systems: A complete ROS was negative except as per HPI.   Physical Exam: Blood pressure (!) 149/89, pulse 86, temperature 98.3 F (36.8 C), temperature source Oral, resp. rate 18, height 6\' 2"  (1.88 m), weight 122.5 kg, SpO2 100 %. Physical Exam  Constitutional: He is oriented to person, place, and time and well-developed, well-nourished, and in no distress.  HENT:  Head: Normocephalic and atraumatic.  Eyes: Pupils are equal, round, and reactive to light. Conjunctivae and EOM are normal.  Cardiovascular: Normal rate and intact  distal pulses. Exam reveals no gallop and no friction rub.  No murmur heard. Pulmonary/Chest: Effort normal and breath sounds normal. No respiratory distress. He exhibits no tenderness.  Abdominal: Soft. He exhibits no distension. There is no abdominal tenderness.  Musculoskeletal:        General: No tenderness or edema. Normal range of motion.     Cervical back: Normal range of motion.  Neurological: He is alert and oriented to person, place, and time. He displays normal reflexes. No cranial nerve deficit. He exhibits normal muscle tone. Coordination normal.  Negative Lhermitte's sign  Skin: Skin is warm and dry.  Psychiatric: Mood and affect normal.     Labs: CBC    Component Value Date/Time   WBC 5.5  06/02/2019 1144   RBC 4.90 06/02/2019 1144   HGB 15.2 06/02/2019 1144   HCT 46.5 06/02/2019 1144   PLT 216 06/02/2019 1144   MCV 94.9 06/02/2019 1144   MCH 31.0 06/02/2019 1144   MCHC 32.7 06/02/2019 1144   RDW 11.9 06/02/2019 1144     CMP     Component Value Date/Time   NA 139 06/02/2019 1144   K 4.0 06/02/2019 1144   CL 102 06/02/2019 1144   CO2 28 06/02/2019 1144   GLUCOSE 100 (H) 06/02/2019 1144   BUN 11 06/02/2019 1144   CREATININE 0.85 06/02/2019 1144   CALCIUM 9.4 06/02/2019 1144   GFRNONAA >60 06/02/2019 1144   GFRAA >60 06/02/2019 1144    Imaging:  MR brain and spinal cord with and without contrast: 1. Examination limited by susceptibility artifact arising from orthodontic hardware.  2. Multifocal T2 hyperintense signal changes within the cerebral white matter, several of which demonstrate corresponding restricted diffusion and enhancement. This includes an 8 mm enhancing lesion within the right thalamocapsular junction which likely explains the patient's reported symptoms. Findings are not entirely specific but highly suggestive of demyelinating disease with active demyelination. MRI cervical spine:   1. No cervical spinal cord signal abnormality or abnormal cord enhancement.  2. At C3-C4, there is mild-to-moderate disc degeneration with C3 inferior endplate Schmorl node. Degenerative endplate edema and enhancement. Left-sided disc osteophyte ridge/uncinate hypertrophy contributes to mild/moderate left neural foraminal narrowing.  3. No significant disc herniation, spinal canal stenosis or neural foraminal narrowing at the remaining levels.   Assessment & Plan by Problem:  Principal Problem:   Demyelinating disease ([place]) Active Problems:   Arm paresthesia, left   Jacob Wong is a 34 y.o. with pertinent PMH of none who presented with acute onset left-sided paresthesias and admit for further evaluation and management on hospital day 0  #Demyelinating  disease Patient presents with acute onset paresthesias with cramping of his left upper and lower extremity concerning for multiple sclerosis.  Patient states that all of his symptoms have mostly resolved at this time.  He does state that he will have mild paresthesias when he gets up really quickly or moves side to side quickly.  Otherwise he denies any symptoms. -Patient was started on 1000 g of methylprednisolone IV. -Neurology consulted. -Vitamin D level -IR consulted for fluoroscopic lumbar puncture -Appreciate neurology and IR's assistance.  #Hyperglycemia: Patient has hyperglycemia in the setting of methylprednisolone.  We will monitor his blood glucose with CBGs 3 times a day with meals and at bedtime. -CBG monitoring  #GERD Continue home PPI of Protonix 40 mg  Diet: Normal VTE: SCDs IVF: None,None Code: Full  Prior to Admission Living Arrangement: Home, living Wife and 4 children Anticipated Discharge  Location: Home Barriers to Discharge: Further medical evaluation and management  Dispo: Admit patient to Observation with expected length of stay less than 2 midnights.  Signed: Dellia Cloud, MD 06/02/2019, 5:17 PM  Pager: [phone]

## 2019-06-03 ENCOUNTER — Inpatient Hospital Stay (HOSPITAL_COMMUNITY): Payer: BC Managed Care – PPO

## 2019-06-03 DIAGNOSIS — R202 Paresthesia of skin: Secondary | ICD-10-CM | POA: Diagnosis present

## 2019-06-03 DIAGNOSIS — R29818 Other symptoms and signs involving the nervous system: Secondary | ICD-10-CM | POA: Diagnosis not present

## 2019-06-03 HISTORY — DX: Paresthesia of skin: R20.2

## 2019-06-03 LAB — CSF CELL COUNT WITH DIFFERENTIAL
Eosinophils, CSF: 0 % (ref 0–1)
Eosinophils, CSF: 0 % (ref 0–1)
Lymphs, CSF: 68 % (ref 40–80)
Lymphs, CSF: 92 % — ABNORMAL HIGH (ref 40–80)
Monocyte-Macrophage-Spinal Fluid: 6 % — ABNORMAL LOW (ref 15–45)
Monocyte-Macrophage-Spinal Fluid: 7 % — ABNORMAL LOW (ref 15–45)
RBC Count, CSF: 104 /mm3 — ABNORMAL HIGH
RBC Count, CSF: 2500 /mm3 — ABNORMAL HIGH
Segmented Neutrophils-CSF: 2 % (ref 0–6)
Segmented Neutrophils-CSF: 25 % — ABNORMAL HIGH (ref 0–6)
Tube #: 1
Tube #: 4
WBC, CSF: 8 /mm3 — ABNORMAL HIGH (ref 0–5)
WBC, CSF: 9 /mm3 — ABNORMAL HIGH (ref 0–5)

## 2019-06-03 LAB — GLUCOSE, CAPILLARY
Glucose-Capillary: 150 mg/dL — ABNORMAL HIGH (ref 70–99)
Glucose-Capillary: 172 mg/dL — ABNORMAL HIGH (ref 70–99)
Glucose-Capillary: 180 mg/dL — ABNORMAL HIGH (ref 70–99)
Glucose-Capillary: 159 mg/dL — ABNORMAL HIGH (ref 70–99)

## 2019-06-03 LAB — VITAMIN D 25 HYDROXY (VIT D DEFICIENCY, FRACTURES): Vit D, 25-Hydroxy: 20.51 ng/mL — ABNORMAL LOW (ref 30–100)

## 2019-06-03 LAB — PROTEIN, CSF: Total  Protein, CSF: 41 mg/dL (ref 15–45)

## 2019-06-03 LAB — GLUCOSE, CSF: Glucose, CSF: 90 mg/dL — ABNORMAL HIGH (ref 40–70)

## 2019-06-03 LAB — HIV ANTIBODY (ROUTINE TESTING W REFLEX): HIV Screen 4th Generation wRfx: NONREACTIVE

## 2019-06-03 MED ORDER — LIDOCAINE HCL (PF) 1 % IJ SOLN: 5.0000 mL | Freq: Once | INTRAMUSCULAR | Status: AC

## 2019-06-03 MED ORDER — LIDOCAINE HCL (PF) 1 % IJ SOLN
INTRAMUSCULAR | Status: AC
  Administered 2019-06-03: 3 mL
  Filled 2019-06-03: qty 5

## 2019-06-03 NOTE — Progress Notes (Signed)
Neurology Progress Note   S:// Seen and examined.  Reports less frequency of the left arm and leg numbness/tingling/stiffness.   O:// Current vital signs: BP 123/77 (BP Location: Left Arm)   Pulse 93   Temp 98.1 F (36.7 C) (Oral)   Resp 18   Ht 6\' 2"  (1.88 m)   Wt 121 kg   SpO2 96%   BMI 34.25 kg/m  Vital signs in last 24 hours: Temp:  [97.9 F (36.6 C)-98.8 F (37.1 C)] 98.1 F (36.7 C) (03/23 1654) Pulse Rate:  [86-106] 93 (03/23 1654) Resp:  [16-18] 18 (03/23 1654) BP: (118-146)/(70-88) 123/77 (03/23 1654) SpO2:  [96 %-100 %] 96 % (03/23 1654) Weight:  01-30-1980 kg] 121 kg (03/22 2109) Awake alert in no distress Normocephalic, atraumatic Lungs clear to auscultation Regular rate rhythm Soft nondistended nontender abdomen Neurological exam Awake alert oriented x3 Nondysarthric No evidence of aphasia Cranial nerves II to XII intact Motor exam: 5/5 in all fours Sensory exam: Intact light touch Coordination: Cerebellar function intact in both upper and lower extremities Gait testing deferred at this time   Medications  Current Facility-Administered Medications:  .  acetaminophen (TYLENOL) tablet 650 mg, 650 mg, Oral, Q6H PRN, 650 mg at 06/03/19 0747 **OR** acetaminophen (TYLENOL) suppository 650 mg, 650 mg, Rectal, Q6H PRN, Winfrey, William B, MD .  insulin aspart (novoLOG) injection 0-9 Units, 0-9 Units, Subcutaneous, TID WC, 06/05/19, MD, 2 Units at 06/03/19 1244 .  methylPREDNISolone sodium succinate (SOLU-MEDROL) 1,000 mg in sodium chloride 0.9 % 50 mL IVPB, 1,000 mg, Intravenous, Daily, 06/05/19, PA-C, Last Rate: 58 mL/hr at 06/03/19 1035, 1,000 mg at 06/03/19 1035 .  pantoprazole (PROTONIX) injection 40 mg, 40 mg, Intravenous, Q24H, 06/05/19, PA-C, 40 mg at 06/02/19 1827 Labs CBC    Component Value Date/Time   WBC 5.5 06/02/2019 1144   RBC 4.90 06/02/2019 1144   HGB 15.2 06/02/2019 1144   HCT 46.5 06/02/2019 1144   PLT 216 06/02/2019 1144    MCV 94.9 06/02/2019 1144   MCH 31.0 06/02/2019 1144   MCHC 32.7 06/02/2019 1144   RDW 11.9 06/02/2019 1144    CMP     Component Value Date/Time   NA 139 06/02/2019 1144   K 4.0 06/02/2019 1144   CL 102 06/02/2019 1144   CO2 28 06/02/2019 1144   GLUCOSE 100 (H) 06/02/2019 1144   BUN 11 06/02/2019 1144   CREATININE 0.85 06/02/2019 1144   CALCIUM 9.4 06/02/2019 1144   GFRNONAA >60 06/02/2019 1144   GFRAA >60 06/02/2019 1144   CSF examination with a bloody tap, white count mildly elevated but not concerning for infection given the bloody tap.  Normal protein.  Mildly elevated glucose. IgG index and oligoclonal bands pending  Serum neuromyelitis optica antibodies pending  Imaging I have reviewed images in epic and the results pertinent to this consultation are: MRI brain with multiple T2/FLAIR hyperintense signals in the cerebral white matter with several demonstrating restricted diffusion and enhancement consistent with demyelinating disease and active demyelination.  MRI of the C-spine with no spinal cord abnormality.    Assessment:  New onset demyelinating disease likely Multiple Sclerosis with active disease as evidenced by symptoms and imaging showing enhancing lesions in the brain. Other differentials include neuromyelitis optica  Recommendations: Continue 5 days of steroids Watch sugars closely GI protection while on steroids Follow-up on IgG index and oligoclonal bands in the CSF and neuromyelitis optica antibodies blood. Outpatient neurology follow-up with Dr. 06/04/2019  [facility] at [facility]  -- Amie Portland, MD [facility] Pager: [phone] If 7pm to 7am, please call on call as listed on AMION.

## 2019-06-03 NOTE — Progress Notes (Addendum)
Subjective: HD#1 Events Overnight: No events overnight  Patient was seen this morning on rounds. Jacob Wong reports paresthesias are improving but still residually there. The LUE is the extremity that felt the most numb. He has been able to walk to the bathroom and back without difficulty. Denies any vision changes, including blurry vision.   He works at a [employer] and lives in [place] for the past 7 years. He has an appt with [facility] coming up but does not have a PCP otherwise.   Objective:  Vital signs in last 24 hours: Vitals:   06/02/19 2108 06/02/19 2109 06/03/19 0013 06/03/19 0338  BP: 133/88  135/75 118/70  Pulse: (!) 103  99 98  Resp: 16  17 17   Temp: 98.2 F (36.8 C)  97.9 F (36.6 C) 97.9 F (36.6 C)  TempSrc: Oral  Oral Oral  SpO2: 98%  98% 99%  Weight:  121 kg    Height:       Supplemental O2: Room air   Physical Exam: Physical Exam  Constitutional: He is oriented to person, place, and time and well-developed, well-nourished, and in no distress.  HENT:  Head: Normocephalic and atraumatic.  Cardiovascular: Normal rate and intact distal pulses.  No murmur heard. Pulmonary/Chest: Effort normal and breath sounds normal.  Abdominal: Soft. Bowel sounds are normal.  Musculoskeletal:        General: No tenderness or edema. Normal range of motion.     Cervical back: Normal range of motion.  Neurological: He is alert and oriented to person, place, and time. No cranial nerve deficit. Coordination normal.  Skin: Skin is warm and dry.  Psychiatric: Affect normal.    Filed Weights   06/02/19 1004 06/02/19 2109  Weight: 122.5 kg 121 kg     Intake/Output Summary (Last 24 hours) at 06/03/2019 0623 Last data filed at 06/03/2019 9381 Gross per 24 hour  Intake 50 ml  Output 400 ml  Net -350 ml    Risk Score:  N/A  Pertinent labs/Imaging: CBC Latest Ref Rng & Units 06/02/2019  WBC 4.0 - 10.5 K/uL 5.5  Hemoglobin 13.0 - 17.0 g/dL 15.2    Hematocrit 39.0 - 52.0 % 46.5  Platelets 150 - 400 K/uL 216    CMP Latest Ref Rng & Units 06/02/2019  Glucose 70 - 99 mg/dL 100(H)  BUN 6 - 20 mg/dL 11  Creatinine 0.61 - 1.24 mg/dL 0.85  Sodium 135 - 145 mmol/L 139  Potassium 3.5 - 5.1 mmol/L 4.0  Chloride 98 - 111 mmol/L 102  CO2 22 - 32 mmol/L 28  Calcium 8.9 - 10.3 mg/dL 9.4   Vitamin D: pending  Hgb A1c: 4.9 LP labs: pending   Assessment/Plan:  Active Problems:   Demyelinating disease ([place])  Patient Summary: Jacob Wong is a 34 y.o. with pertinent PMH of none who presented with parethesias and admit for demyelinating disease concerning for MS on hospital day 1  #Demyelinating disease Patient states that he is continued to have minimal symptoms at this time.  He is tolerating the prednisone well.  We will continue to keep him on high-dose prednisone for 3 days and then transition him to oral medications.  Patient is scheduled for LP today via IR. - LP pending - continue 1 g Solu-Medrol.  #Hyperglycemia: Patient has hyperglycemia in the setting of methylprednisolone.  We will monitor his blood glucose with CBGs 3 times a day with meals and at bedtime. -CBG monitoring  #GERD Continue home  PPI of Protonix 40 mg   Diet: Normal IVF: None,None VTE: SCDs Code: Full PT/OT recs: None TOC recs: None   Dispo: Anticipated discharge pending further evaluation and management.    Dellia Cloud, D.O. [facility], PGY-1 Date 06/03/2019 Time 6:23 AM

## 2019-06-04 ENCOUNTER — Inpatient Hospital Stay (HOSPITAL_COMMUNITY): Payer: BC Managed Care – PPO

## 2019-06-04 DIAGNOSIS — E559 Vitamin D deficiency, unspecified: Secondary | ICD-10-CM

## 2019-06-04 DIAGNOSIS — M5124 Other intervertebral disc displacement, thoracic region: Secondary | ICD-10-CM | POA: Diagnosis not present

## 2019-06-04 LAB — IGG CSF INDEX
Albumin CSF-mCnc: 24 mg/dL (ref 11–48)
Albumin: 4.6 g/dL (ref 4.0–5.0)
CSF IgG Index: 2.3 — ABNORMAL HIGH (ref 0.0–0.7)
IgG (Immunoglobin G), Serum: 1388 mg/dL (ref 603–1613)
IgG, CSF: 17 mg/dL — ABNORMAL HIGH (ref 0.0–8.6)
IgG/Alb Ratio, CSF: 0.71 — ABNORMAL HIGH (ref 0.00–0.25)

## 2019-06-04 LAB — NEUROMYELITIS OPTICA AUTOAB, IGG: NMO-IgG: <1.5 U/mL (ref 0.0–3.0)

## 2019-06-04 LAB — GLUCOSE, CAPILLARY
Glucose-Capillary: 153 mg/dL — ABNORMAL HIGH (ref 70–99)
Glucose-Capillary: 125 mg/dL — ABNORMAL HIGH (ref 70–99)
Glucose-Capillary: 192 mg/dL — ABNORMAL HIGH (ref 70–99)
Glucose-Capillary: 180 mg/dL — ABNORMAL HIGH (ref 70–99)

## 2019-06-04 MED ORDER — ENOXAPARIN SODIUM 40 MG/0.4ML ~~LOC~~ SOLN
40.0000 mg | SUBCUTANEOUS | Status: DC
  Administered 2019-06-04 – 2019-06-05 (×2): 40 mg via SUBCUTANEOUS
  Filled 2019-06-04 (×2): qty 0.4

## 2019-06-04 MED ORDER — VITAMIN D (ERGOCALCIFEROL) 1.25 MG (50000 UNIT) PO CAPS
50000.0000 [IU] | ORAL_CAPSULE | ORAL | Status: DC
  Administered 2019-06-04: 50000 [IU] via ORAL
  Filled 2019-06-04: qty 1

## 2019-06-04 MED ORDER — PANTOPRAZOLE SODIUM 40 MG PO TBEC
40.0000 mg | DELAYED_RELEASE_TABLET | Freq: Every day | ORAL | Status: DC
  Administered 2019-06-04 – 2019-06-05 (×2): 40 mg via ORAL
  Filled 2019-06-04 (×2): qty 1

## 2019-06-04 MED ORDER — INSULIN GLARGINE 100 UNIT/ML ~~LOC~~ SOLN
3.0000 [IU] | Freq: Every day | SUBCUTANEOUS | Status: DC
  Administered 2019-06-04 – 2019-06-06 (×3): 3 [IU] via SUBCUTANEOUS
  Filled 2019-06-04 (×3): qty 0.03

## 2019-06-04 MED ORDER — GADOBUTROL 1 MMOL/ML IV SOLN
10.0000 mL | Freq: Once | INTRAVENOUS | Status: AC | PRN
  Administered 2019-06-04: 10 mL via INTRAVENOUS

## 2019-06-04 NOTE — Progress Notes (Signed)
Subjective: HD#2 Events Overnight: no overnight events  Mr. Twyford endorses continued cramping in his legs that occurs concurrently with numbness, however the numbness never occurs without cramps. He is walking without difficulty. He endorses a good appetite and denies abdominal pain.   We discussed plan to continue IV steroids for 5 days to treat as an acute flare. We are discussed the importance of maintenance therapy in the future. Pt endorses understanding. All questions and concerns addressed.  Objective:  Vital signs in last 24 hours: Vitals:   06/03/19 1654 06/03/19 2054 06/04/19 0002 06/04/19 0325  BP: 123/77 (!) 141/78 121/75 117/74  Pulse: 93 95 92 84  Resp: 18 17 17 16   Temp: 98.1 F (36.7 C) 98.6 F (37 C) 97.9 F (36.6 C) 98.2 F (36.8 C)  TempSrc: Oral Oral Oral Oral  SpO2: 96% 95% 95% 97%  Weight:      Height:       Supplemental O2: RA   Physical Exam: Physical Exam  Constitutional: He is oriented to person, place, and time and well-developed, well-nourished, and in no distress.  HENT:  Head: Normocephalic and atraumatic.  Eyes: EOM are normal.  Cardiovascular: Normal rate and intact distal pulses.  Pulmonary/Chest: Effort normal and breath sounds normal.  Abdominal: Soft. He exhibits no distension. There is no abdominal tenderness.  Musculoskeletal:        General: No tenderness or edema. Normal range of motion.     Comments: Continues to have mild cramping of his left upper and lower extremity.  Neurological: He is alert and oriented to person, place, and time.  Skin: Skin is warm and dry.    Filed Weights   06/02/19 1004 06/02/19 2109  Weight: 122.5 kg 121 kg     Intake/Output Summary (Last 24 hours) at 06/04/2019 9892 Last data filed at 06/03/2019 1800 Gross per 24 hour  Intake 960 ml  Output 1100 ml  Net -140 ml    Risk Score: A  Pertinent labs/Imaging: CBC Latest Ref Rng & Units 06/02/2019  WBC 4.0 - 10.5 K/uL 5.5  Hemoglobin 13.0  - 17.0 g/dL 15.2  Hematocrit 39.0 - 52.0 % 46.5  Platelets 150 - 400 K/uL 216    CMP Latest Ref Rng & Units 06/02/2019  Glucose 70 - 99 mg/dL 100(H)  BUN 6 - 20 mg/dL 11  Creatinine 0.61 - 1.24 mg/dL 0.85  Sodium 135 - 145 mmol/L 139  Potassium 3.5 - 5.1 mmol/L 4.0  Chloride 98 - 111 mmol/L 102  CO2 22 - 32 mmol/L 28  Calcium 8.9 - 10.3 mg/dL 9.4   Lumbar puncture results: -Traumatic tap with pink/hazy CSF discoloration -High RBCs -Pleocytosis -No microorganisms seen, negative gram stain, culture pending -Normal protein -Elevated glucose   Vit D, 25-Hydroxy 30 - 100 ng/mL 20.51Low     Pending: 1. CSF IgG 2. CSF Oligoclonal bands 3. CSF culture   Assessment/Plan:  Principal Problem:   Demyelinating disease ([place]) Active Problems:   Arm paresthesia, left   Patient Summary: Jacob Wong is a 34 y.o. with pertinent PMH of none who presented with left-sided parethesias and admit for suspected MS on hospital day 2  #Demyelinating disease On day/dose 3 of 1 g Solu-Medrol and tolerating it well.  PE results show pleocytosis with normal protein concerning for multiple sclerosis.  CSF Ig G, oligoclonal bands, and NMO antibodies are pending.  Recommends a 5-day course of steroids prior to being transitioned to oral medications. -Continue 1 g Solu-Medrol IV -Appreciate  neurology's recommendations and assistance comanaging this patient.  #Vitamin D deficiency: Considering patient's suspected diagnosis of multiple sclerosis low vitamin D levels, I will supplement his vitamin D during this hospitalization.  He will need follow-up with his outpatient physician to finish his course of vitamin D supplementation. - Supplement with Ergocalciferol 50,000 units per week.  #Hyperglycemia: Patient's blood glucose was elevated this morning at 180.  Having scale insulin and is required 3 units in the last 24 hours. - I will switch him to 3 units daily of Lantus  #GERD Continue home  PPI of Protonix 40 mg  Diet: Normal IVF: None,None VTE: Enoxaparin Code: Full PT/OT recs: None TOC recs: none   Dispo: Anticipated discharge Friday.    Dellia Cloud, D.O. [facility], PGY-1 Date 06/04/2019 Time 6:21 AM

## 2019-06-04 NOTE — Plan of Care (Signed)
Brief no charge note  Patient seen and examined. No changes in exam since yesterday Reports some continuing stiffness/spasm in the left arm.  Recommendations: -Continue to complete 5 days of high-dose IV steroids -Follow-up on IgG index and oligoclonal bands as well as neuromyelitis optica antibodies. -PT OT speech therapy as needed -Follow-up with outpatient neurology  Plan discussed with the rounding team members on the floor.  Neurology will be available as needed.  Please call with questions   -- Milon Dikes, MD [facility] Pager: [phone] If 7pm to 7am, please call on call as listed on AMION.

## 2019-06-04 NOTE — Plan of Care (Signed)
Plan of care reviewed with pt at bedside. Pt adlib, VSS, meds given per orders. Call bell in reach. Pt stable at this time, will continue to monitor.  Problem: Education: Goal: Knowledge of General Education information will improve Description: Including pain rating scale, medication(s)/side effects and non-pharmacologic comfort measures Outcome: Progressing   Problem: Health Behavior/Discharge Planning: Goal: Ability to manage health-related needs will improve Outcome: Progressing   Problem: Clinical Measurements: Goal: Ability to maintain clinical measurements within normal limits will improve Outcome: Progressing Goal: Will remain free from infection Outcome: Progressing Goal: Diagnostic test results will improve Outcome: Progressing

## 2019-06-05 LAB — GLUCOSE, CAPILLARY
Glucose-Capillary: 116 mg/dL — ABNORMAL HIGH (ref 70–99)
Glucose-Capillary: 97 mg/dL (ref 70–99)
Glucose-Capillary: 125 mg/dL — ABNORMAL HIGH (ref 70–99)
Glucose-Capillary: 120 mg/dL — ABNORMAL HIGH (ref 70–99)
Glucose-Capillary: 184 mg/dL — ABNORMAL HIGH (ref 70–99)

## 2019-06-05 LAB — OLIGOCLONAL BANDS, CSF + SERM

## 2019-06-05 NOTE — Plan of Care (Signed)
VSS, plan of care reviewed with pt. Call bell in reach. Pt stable at this time, will continue to monitor.  Problem: Education: Goal: Knowledge of General Education information will improve Description: Including pain rating scale, medication(s)/side effects and non-pharmacologic comfort measures Outcome: Progressing   Problem: Health Behavior/Discharge Planning: Goal: Ability to manage health-related needs will improve Outcome: Progressing   Problem: Clinical Measurements: Goal: Ability to maintain clinical measurements within normal limits will improve Outcome: Progressing Goal: Will remain free from infection Outcome: Progressing Goal: Diagnostic test results will improve Outcome: Progressing

## 2019-06-05 NOTE — Progress Notes (Signed)
Subjective: HD#3 Events Overnight: No events overnight  Patient was seen this morning on rounds. He reports that his tingling is still persistent. He does not endorse much numbness. He states that he was concerned about his ongoing persistent symptoms.   Objective:  Vital signs in last 24 hours: Vitals:   06/04/19 1719 06/04/19 2019 06/05/19 0018 06/05/19 0353  BP:  (!) 149/93 125/69 131/72  Pulse:  97 76 81  Resp: 18 18 18 18   Temp:  98.6 F (37 C) 97.6 F (36.4 C) 98 F (36.7 C)  TempSrc:  Oral Oral Oral  SpO2:  95% 100% 100%  Weight:      Height:       Supplemental O2: Room air  Physical Exam: Physical Exam  Constitutional: He is oriented to person, place, and time and well-developed, well-nourished, and in no distress.  HENT:  Head: Normocephalic and atraumatic.  Eyes: EOM are normal.  Cardiovascular: Normal rate and intact distal pulses.  No murmur heard. Pulmonary/Chest: No respiratory distress.  Abdominal: Soft. There is no abdominal tenderness.  Musculoskeletal:        General: No tenderness or edema. Normal range of motion.     Cervical back: Normal range of motion.  Neurological: He is alert and oriented to person, place, and time. No cranial nerve deficit.  5/5 strength bilaterally extremities  Skin: Skin is warm and dry.    Filed Weights   06/02/19 1004 06/02/19 2109  Weight: 122.5 kg 121 kg     Intake/Output Summary (Last 24 hours) at 06/05/2019 0600 Last data filed at 06/04/2019 1108 Gross per 24 hour  Intake 750 ml  Output --  Net 750 ml    Risk Score: N/A  Pertinent labs/Imaging: CBC Latest Ref Rng & Units 06/02/2019  WBC 4.0 - 10.5 K/uL 5.5  Hemoglobin 13.0 - 17.0 g/dL 15.2  Hematocrit 39.0 - 52.0 % 46.5  Platelets 150 - 400 K/uL 216    CMP Latest Ref Rng & Units 06/02/2019  Glucose 70 - 99 mg/dL 100(H)  BUN 6 - 20 mg/dL 11  Creatinine 0.61 - 1.24 mg/dL 0.85  Sodium 135 - 145 mmol/L 139  Potassium 3.5 - 5.1 mmol/L 4.0  Chloride  98 - 111 mmol/L 102  CO2 22 - 32 mmol/L 28  Calcium 8.9 - 10.3 mg/dL 9.4   CSF labs: IgG: 17 IgG/albumin ratio: 0.71 IgG index: 2.3 Oligoclonal bands: pending  CSF Culture: pending   MR thoracic spine  contrast: - Focus of increased T2 signal on the lateral aspect of the spinal cord at the level of T7-8, consistent with the clinical diagnosis of demyelinating disease.  - No abnormal contrast enhancement.  Assessment/Plan:  Principal Problem:   Demyelinating disease ([place]) Active Problems:   Arm paresthesia, left   Patient Summary: Jacob Wong is a 34 y.o. with pertinent PMH of none who presented with left-sided paresthesias and cramping and admit for suspected multiple sclerosis on hospital day 3  #Multiple sclerosis Patient is on day 4 of high dose corticosteroids and tolerating it well.  - Continue 1 g Solu-Medrol IV for 2 more days for total of 5 days -Appreciate neurology's recommendations and assistance comanaging the patient  #Vitamin D deficiency: -Patient will need close follow-up with a primary care physician to continue vitamin D supplementation.  #Hyperglycemia: Patient's blood glucose is well controlled at 116 this morning. -Continue Lantus 3 units daily -Continue sliding scale insulin  #GERD -Continue Protonix 40 mg for GI protection in the  setting of corticosteroids.  Diet: Normal IVF: None,None VTE: Enoxaparin Code: Full PT/OT recs: None TOC recs: None   Dispo: Anticipated discharge tomorrow.    Dellia Cloud, D.O. [facility], PGY-1 Date 06/05/2019 Time 6:00 AM

## 2019-06-06 DIAGNOSIS — G35 Multiple sclerosis: Principal | ICD-10-CM

## 2019-06-06 LAB — GLUCOSE, CAPILLARY
Glucose-Capillary: 105 mg/dL — ABNORMAL HIGH (ref 70–99)
Glucose-Capillary: 88 mg/dL (ref 70–99)

## 2019-06-06 LAB — CSF CULTURE W GRAM STAIN: Culture: NO GROWTH

## 2019-06-06 NOTE — TOC Transition Note (Signed)
Transition of Care [facility]) - CM/SW Discharge Note   Patient Details  Name: Jacob Wong MRN: [id number] Date of Birth: 1986-02-19  Transition of [facility]) CM/SW Contact:  Kermit Balo, RN Phone Number: 06/06/2019, 1:52 PM   Clinical Narrative:    Pt discharging home with self care.  PCP: Dr Janne Lab Pt denies any issues with transportation and not on any medications at home. Pt has transportation home.    Final next level of care: Home/Self Care Barriers to Discharge: No Barriers Identified   Patient Goals and CMS Choice        Discharge Placement                       Discharge Plan and Services                                     Social Determinants of Health (SDOH) Interventions     Readmission Risk Interventions No flowsheet data found.

## 2019-06-06 NOTE — Progress Notes (Signed)
Neurology Progress Note   S:// Seen and examined.  Reports intermittent left hand stiffening but otherwise no other symptoms   O:// Current vital signs: BP (!) 146/95 (BP Location: Right Arm)   Pulse 89   Temp 98.2 F (36.8 C) (Oral)   Resp 20   Ht 6\' 2"  (1.88 m)   Wt 121 kg   SpO2 100%   BMI 34.25 kg/m  Vital signs in last 24 hours: Temp:  [97.6 F (36.4 C)-98.2 F (36.8 C)] 98.2 F (36.8 C) (03/26 0750) Pulse Rate:  [69-89] 89 (03/26 0750) Resp:  [16-20] 20 (03/26 0750) BP: (109-149)/(65-103) 146/95 (03/26 0750) SpO2:  [96 %-100 %] 100 % (03/26 0750) Neurological exam Awake alert oriented x3 Speech is nondysarthric No evidence of aphasia Cranial nerves II to XII intact Motor exam 5/5 all over Sensory exam: Intact to light touch although subjectively feels sometimes the left hand does not feel normal. Coordination: No dysmetria on finger-nose-finger testing bilaterally Gait is normal   Medications  Current Facility-Administered Medications:  .  acetaminophen (TYLENOL) tablet 650 mg, 650 mg, Oral, Q6H PRN, 650 mg at 06/05/19 1219 **OR** acetaminophen (TYLENOL) suppository 650 mg, 650 mg, Rectal, Q6H PRN, Winfrey, William B, MD .  enoxaparin (LOVENOX) injection 40 mg, 40 mg, Subcutaneous, Q24H, 1220, MD, 40 mg at 06/05/19 1219 .  insulin aspart (novoLOG) injection 0-9 Units, 0-9 Units, Subcutaneous, TID WC, 1220, MD, 1 Units at 06/05/19 1219 .  insulin glargine (LANTUS) injection 3 Units, 3 Units, Subcutaneous, Daily, 1220, MD, 3 Units at 06/06/19 1008 .  pantoprazole (PROTONIX) EC tablet 40 mg, 40 mg, Oral, Q supper, 06/08/19, RPH, 40 mg at 06/05/19 1737 .  Vitamin D (Ergocalciferol) (DRISDOL) capsule 50,000 Units, 50,000 Units, Oral, Q7 days, 06/07/19, MD, 50,000 Units at 06/04/19 0700 Labs CBC    Component Value Date/Time   WBC 5.5 06/02/2019 1144   RBC 4.90 06/02/2019 1144   HGB 15.2 06/02/2019 1144   HCT 46.5  06/02/2019 1144   PLT 216 06/02/2019 1144   MCV 94.9 06/02/2019 1144   MCH 31.0 06/02/2019 1144   MCHC 32.7 06/02/2019 1144   RDW 11.9 06/02/2019 1144    CMP     Component Value Date/Time   NA 139 06/02/2019 1144   K 4.0 06/02/2019 1144   CL 102 06/02/2019 1144   CO2 28 06/02/2019 1144   GLUCOSE 100 (H) 06/02/2019 1144   BUN 11 06/02/2019 1144   CREATININE 0.85 06/02/2019 1144   CALCIUM 9.4 06/02/2019 1144   ALBUMIN 4.6 06/03/2019 1410   GFRNONAA >60 06/02/2019 1144   GFRAA >60 06/02/2019 1144    IgG index pending, O-bands pending  NMO negative   Imaging I have reviewed images in epic and the results pertinent to this consultation are: MR brain with multiple T2 hyperintense changes with restricted diffusion enhancement including 1 in the right thalamic capsular junction concerning for demyelinating disease.   Assessment: New onset demyelinating disease likely multiple sclerosis. Negative neuromyelitis optica antibodies.  Negative spine imaging. Pending oligoclonal bands and IgG index.  Recommendations: Completing 5 days of steroids today. Can be discharged after the 5 days of steroids are completed. We will recommend follow-up with MS specialist outpatient-Dr. 06/04/2019 at [facility] discussion and initiation of disease modifying therapy Please call neurology with questions.  -- [facility], MD [facility] Pager: [phone] If 7pm to 7am, please call on call as listed on AMION.

## 2019-06-06 NOTE — Progress Notes (Signed)
   Subjective: HD#4 Events Overnight: No events overnight.   Patient was seen this morning on rounds. Patient states that he is doing well today. He continues to have residual cramping, but otherwise, he states that he is ready to go home.  Objective:  Vital signs in last 24 hours: Vitals:   06/05/19 2341 06/06/19 0401 06/06/19 0750 06/06/19 1213  BP: 109/65 115/72 (!) 146/95 (!) 140/93  Pulse: 78 69 89 84  Resp: 16 16 20    Temp: 97.8 F (36.6 C) 97.6 F (36.4 C) 98.2 F (36.8 C)   TempSrc: Oral Oral Oral   SpO2: 98% 99% 100% 100%  Weight:      Height:       Supplemental O2: RA  Physical Exam: Physical Exam  Constitutional: He is oriented to person, place, and time and well-developed, well-nourished, and in no distress.  HENT:  Head: Normocephalic and atraumatic.  Eyes: Pupils are equal, round, and reactive to light. EOM are normal.  Cardiovascular: Normal rate and intact distal pulses.  Pulmonary/Chest: Effort normal and breath sounds normal. No respiratory distress.  Abdominal: Soft. Bowel sounds are normal. He exhibits no distension.  Musculoskeletal:        General: No tenderness or edema. Normal range of motion.     Cervical back: Normal range of motion.  Neurological: He is alert and oriented to person, place, and time.  Skin: Skin is warm.    Filed Weights   06/02/19 1004 06/02/19 2109  Weight: 122.5 kg 121 kg     Intake/Output Summary (Last 24 hours) at 06/06/2019 1318 Last data filed at 06/06/2019 1200 Gross per 24 hour  Intake 908.01 ml  Output --  Net 908.01 ml    Risk Score: N/A  Pertinent labs/Imaging: CBC Latest Ref Rng & Units 06/02/2019  WBC 4.0 - 10.5 K/uL 5.5  Hemoglobin 13.0 - 17.0 g/dL 06/04/2019  Hematocrit 44.8 - 52.0 % 46.5  Platelets 150 - 400 K/uL 216    CMP Latest Ref Rng & Units 06/02/2019  Glucose 70 - 99 mg/dL 06/04/2019)  BUN 6 - 20 mg/dL 11  Creatinine 631(S - 9.70 mg/dL 2.63  Sodium 7.85 - 885 mmol/L 139  Potassium 3.5 - 5.1 mmol/L  4.0  Chloride 98 - 111 mmol/L 102  CO2 22 - 32 mmol/L 28  Calcium 8.9 - 10.3 mg/dL 9.4    No results found.   Assessment/Plan:  Principal Problem:   Demyelinating disease ([facility]) Active Problems:   Arm paresthesia, left    Patient Summary: Jacob Wong is a 34 y.o. with pertinent PMH of none who was admit for multiple sclerosis on hospital day 4  #Multiple sclerosis Patient finished his last day of steroid therapy. He continues to have cramping in his left upper and lower extremities. He will be discharged with today with close follow up with his PCP and neurology.  #Vitamin D deficiency: -Patient will need close follow-up with a primary care physician to continue vitamin D supplementation.  #Hyperglycemia: Patient finished his last day of steroids today. His blood sugar will return to normal.  - continue SSI and lantus until discharge  #GERD -Continue Protonix 40 mg for GI protection in the setting of corticosteroids.  Diet: Normal IVF: None,None VTE: Enoxaparin Code: Full PT/OT recs: None TOC recs: none   Dispo: Anticipated discharge today.    20, D.O. [facility], PGY-1 Date 06/06/2019 Time 1:18 PM

## 2019-06-06 NOTE — Plan of Care (Signed)
Patient stable, discussed POC with patient, agreeable with plan, denies question/concerns at this time.  

## 2019-06-09 ENCOUNTER — Ambulatory Visit: Payer: BC Managed Care – PPO

## 2019-06-10 NOTE — Discharge Summary (Signed)
Name: Jacob Wong MRN: [id number] DOB: 15-Jan-1986 34 y.o. PCP: Patient, No Pcp Per  Date of Admission: 06/02/2019 10:02 AM Date of Discharge: 06/06/2019 Attending Physician: Lalla Brothers, MD FACP  Discharge Diagnosis: 1.  Multiple sclerosis  Discharge Medications: Allergies as of 06/06/2019      Reactions   Amoxicillin Rash   Did it involve swelling of the face/tongue/throat, SOB, or low BP? No Did it involve sudden or severe rash/hives, skin peeling, or any reaction on the inside of your mouth or nose? Yes Did you need to seek medical attention at a hospital or doctor's office? Yes When did it last happen?Pt was a child If all above answers are "NO", may proceed with cephalosporin use.      Medication List    You have not been prescribed any medications.     Disposition and follow-up:   [name] was discharged from [facility] in Good condition.  At the hospital follow up visit please address:  1.  Follow-up: 1) multiple sclerosis-patient will need follow-up with neurology for further evaluation and management of his multiple sclerosis, 2) vitamin D deficiency-patient was given 1 dose of vitamin D in the ED but will need 7-8 more weeks of supplementation.  2.  Labs / imaging needed at time of follow-up: BMP, CBG  3.  Pending labs/ test needing follow-up: None  Follow-up Appointments: 1.  Patient states that he has already set up a primary care appointment prior to his admission.  He has his first appointment with his PCP and 1 to 2 weeks after discharge.  2.  Neurology will set up outpatient follow-up for further evaluation and management of his multiple sclerosis  Hospital Course by problem list: 1.  Multiple sclerosis -his current ED on 0 322 with a left sided paresthesias and cramping.  The change in sensation was specifically in the left lower extremity and left upper extremity.  MRI of the head and spine showed white matter lesions  concerning for multiple sclerosis and lumbar puncture results confirm the diagnosis.  Patient was started on 5 days of high-dose methylprednisolone at 1 g IV.  Patient was found to have low vitamin D was given supplementation in the hospital.  Patient was discharged after the 5-day course of steroids.  He will get follow-up with neurology in the outpatient setting for further evaluation and management.  2.  Patient presented with low vitamin D levels at 20.51 he was given supplementation.  He is instructed to follow-up with a primary care physician to finish his vitamin D supplementation.  Discharge Vitals:   BP (!) 140/93 (BP Location: Left Arm)   Pulse 84   Temp 98.2 F (36.8 C) (Oral)   Resp 20   Ht 6\' 2"  (1.88 m)   Wt 121 kg   SpO2 100%   BMI 34.25 kg/m   Pertinent Labs, Studies, and Procedures:   MR brain and spinal cord with and without contrast: 1. Examination limited by susceptibility artifact arising from orthodontic hardware.  2. Multifocal T2 hyperintense signal changes within the cerebral white matter, several of which demonstrate corresponding restricted diffusion and enhancement. This includes an 8 mm enhancing lesion within the right thalamocapsular junction which likely explains the patient's reported symptoms. Findings are not entirely specific but highly suggestive of demyelinating disease with active demyelination. MRI cervical spine:   1. No cervical spinal cord signal abnormality or abnormal cord enhancement.  2. At C3-C4, there is mild-to-moderate disc degeneration with C3 inferior  endplate Schmorl node. Degenerative endplate edema and enhancement. Left-sided disc osteophyte ridge/uncinate hypertrophy contributes to mild/moderate left neural foraminal narrowing.  3. No significant disc herniation, spinal canal stenosis or neural foraminal narrowing at the remaining levels.  Lumbar puncture: -Traumatic tap with pink/hazy CSF discoloration -High  RBCs -Pleocytosis -No microorganisms seen, negative gram stain, culture pending -Normal protein -Elevated glucose -Elevated CSF IgG -Positive oligoclonal bands   Vitamin D: 20.51  Discharge Instructions: Discharge Instructions    Diet - low sodium heart healthy   Complete by: As directed    Discharge instructions   Complete by: As directed    [name],   It was a pleasure taking care of you in the hospital,. You were discovered to have Multiple Sclerosis and received steroids. Please follow up with the neurologist.   Take care!   Increase activity slowly   Complete by: As directed       Signed: Dellia Cloud, MD 06/10/2019, 6:14 PM   Pager: [phone]

## 2019-06-12 ENCOUNTER — Encounter: Payer: Self-pay | Admitting: Adult Health Nurse Practitioner

## 2019-06-12 ENCOUNTER — Telehealth: Payer: Self-pay | Admitting: Adult Health Nurse Practitioner

## 2019-06-12 ENCOUNTER — Other Ambulatory Visit: Payer: Self-pay

## 2019-06-12 ENCOUNTER — Ambulatory Visit (INDEPENDENT_AMBULATORY_CARE_PROVIDER_SITE_OTHER): Payer: BC Managed Care – PPO | Admitting: Adult Health Nurse Practitioner

## 2019-06-12 VITALS — BP 143/90 | HR 98 | Temp 97.8°F | Ht 74.0 in | Wt 275.6 lb

## 2019-06-12 DIAGNOSIS — R202 Paresthesia of skin: Secondary | ICD-10-CM

## 2019-06-12 DIAGNOSIS — Z7689 Persons encountering health services in other specified circumstances: Secondary | ICD-10-CM

## 2019-06-12 DIAGNOSIS — G379 Demyelinating disease of central nervous system, unspecified: Secondary | ICD-10-CM

## 2019-06-12 DIAGNOSIS — R7309 Other abnormal glucose: Secondary | ICD-10-CM | POA: Diagnosis not present

## 2019-06-12 DIAGNOSIS — Z23 Encounter for immunization: Secondary | ICD-10-CM

## 2019-06-12 LAB — POCT URINALYSIS DIP (MANUAL ENTRY)
Bilirubin, UA: NEGATIVE
Blood, UA: NEGATIVE
Glucose, UA: NEGATIVE mg/dL
Ketones, POC UA: NEGATIVE mg/dL
Leukocytes, UA: NEGATIVE
Nitrite, UA: NEGATIVE
Protein Ur, POC: NEGATIVE mg/dL
Spec Grav, UA: 1.025 (ref 1.010–1.025)
Urobilinogen, UA: 0.2 E.U./dL
pH, UA: 5.5 (ref 5.0–8.0)

## 2019-06-12 LAB — GLUCOSE, POCT (MANUAL RESULT ENTRY): POC Glucose: 80 mg/dl (ref 70–99)

## 2019-06-12 MED ORDER — VITAMIN D (ERGOCALCIFEROL) 1.25 MG (50000 UNIT) PO CAPS: 50000.0000 [IU] | ORAL_CAPSULE | ORAL | 3 refills | Status: DC

## 2019-06-12 NOTE — Patient Instructions (Signed)
° ° ° °  If you have lab work done today you will be contacted with your lab results within the next 2 weeks.  If you have not heard from us then please contact us. The fastest way to get your results is to register for My Chart. ° ° °IF you received an x-ray today, you will receive an invoice from [facility]. Please contact [facility] at [phone] with questions or concerns regarding your invoice.  ° °IF you received labwork today, you will receive an invoice from [facility]. Please contact [facility] at [phone] with questions or concerns regarding your invoice.  ° °Our billing staff will not be able to assist you with questions regarding bills from these companies. ° °You will be contacted with the lab results as soon as they are available. The fastest way to get your results is to activate your My Chart account. Instructions are located on the last page of this paperwork. If you have not heard from us regarding the results in 2 weeks, please contact this office. °  ° ° ° °

## 2019-06-12 NOTE — Telephone Encounter (Signed)
Faxed

## 2019-06-12 NOTE — Telephone Encounter (Signed)
Pt wife called about the forms he left with provider today 4/1. Pt needs this faxed to  [facility] (Fax:[phone]) Pt also needs next appt included on those forms. Pt wife also sent a message in her MyChart account because she states he couldn't reach provider on his.  Please advise.

## 2019-06-13 LAB — CMP14+EGFR
ALT: 29 IU/L (ref 0–44)
AST: 15 IU/L (ref 0–40)
Albumin/Globulin Ratio: 1.6 (ref 1.2–2.2)
Albumin: 4.1 g/dL (ref 4.0–5.0)
Alkaline Phosphatase: 72 IU/L (ref 39–117)
BUN/Creatinine Ratio: 10 (ref 9–20)
BUN: 9 mg/dL (ref 6–20)
Bilirubin Total: 0.4 mg/dL (ref 0.0–1.2)
CO2: 24 mmol/L (ref 20–29)
Calcium: 9.1 mg/dL (ref 8.7–10.2)
Chloride: 103 mmol/L (ref 96–106)
Creatinine, Ser: 0.9 mg/dL (ref 0.76–1.27)
GFR calc Af Amer: 128 mL/min/{1.73_m2} (ref >59)
GFR calc non Af Amer: 111 mL/min/{1.73_m2} (ref >59)
Globulin, Total: 2.6 g/dL (ref 1.5–4.5)
Glucose: 86 mg/dL (ref 65–99)
Potassium: 4.4 mmol/L (ref 3.5–5.2)
Sodium: 141 mmol/L (ref 134–144)
Total Protein: 6.7 g/dL (ref 6.0–8.5)

## 2019-06-13 LAB — CBC WITH DIFFERENTIAL/PLATELET
Basophils Absolute: 0 10*3/uL (ref 0.0–0.2)
Basos: 0 %
EOS (ABSOLUTE): 0.3 10*3/uL (ref 0.0–0.4)
Eos: 3 %
Hematocrit: 42.6 % (ref 37.5–51.0)
Hemoglobin: 14.4 g/dL (ref 13.0–17.7)
Immature Grans (Abs): 0.5 10*3/uL — ABNORMAL HIGH (ref 0.0–0.1)
Immature Granulocytes: 5 %
Lymphocytes Absolute: 2.1 10*3/uL (ref 0.7–3.1)
Lymphs: 23 %
MCH: 31.9 pg (ref 26.6–33.0)
MCHC: 33.8 g/dL (ref 31.5–35.7)
MCV: 94 fL (ref 79–97)
Monocytes Absolute: 0.7 10*3/uL (ref 0.1–0.9)
Monocytes: 8 %
Neutrophils Absolute: 5.4 10*3/uL (ref 1.4–7.0)
Neutrophils: 61 %
Platelets: 211 10*3/uL (ref 150–450)
RBC: 4.52 x10E6/uL (ref 4.14–5.80)
RDW: 11.9 % (ref 11.6–15.4)
WBC: 8.9 10*3/uL (ref 3.4–10.8)

## 2019-06-13 LAB — THYROID PANEL WITH TSH
Free Thyroxine Index: 2.2 (ref 1.2–4.9)
T3 Uptake Ratio: 27 % (ref 24–39)
T4, Total: 8.2 ug/dL (ref 4.5–12.0)
TSH: 2.05 u[IU]/mL (ref 0.450–4.500)

## 2019-06-13 LAB — LIPID PANEL
Chol/HDL Ratio: 3.3 ratio (ref 0.0–5.0)
Cholesterol, Total: 196 mg/dL (ref 100–199)
HDL: 60 mg/dL (ref >39)
LDL Chol Calc (NIH): 122 mg/dL — ABNORMAL HIGH (ref 0–99)
Triglycerides: 78 mg/dL (ref 0–149)
VLDL Cholesterol Cal: 14 mg/dL (ref 5–40)

## 2019-06-13 LAB — IRON,TIBC AND FERRITIN PANEL
Ferritin: 322 ng/mL (ref 30–400)
Iron Saturation: 40 % (ref 15–55)
Iron: 106 ug/dL (ref 38–169)
Total Iron Binding Capacity: 263 ug/dL (ref 250–450)
UIBC: 157 ug/dL (ref 111–343)

## 2019-06-13 LAB — B12 AND FOLATE PANEL
Folate: 11.8 ng/mL (ref >3.0)
Vitamin B-12: 661 pg/mL (ref 232–1245)

## 2019-06-13 LAB — TSH: TSH: 2.3 u[IU]/mL (ref 0.450–4.500)

## 2019-06-13 LAB — MAGNESIUM: Magnesium: 2.1 mg/dL (ref 1.6–2.3)

## 2019-06-25 ENCOUNTER — Telehealth: Payer: Self-pay | Admitting: *Deleted

## 2019-06-25 ENCOUNTER — Encounter: Payer: Self-pay | Admitting: Neurology

## 2019-06-25 ENCOUNTER — Other Ambulatory Visit: Payer: Self-pay

## 2019-06-25 ENCOUNTER — Ambulatory Visit (INDEPENDENT_AMBULATORY_CARE_PROVIDER_SITE_OTHER): Payer: BC Managed Care – PPO | Admitting: Neurology

## 2019-06-25 VITALS — BP 117/78 | HR 96 | Temp 97.1°F | Ht 74.0 in | Wt 274.2 lb

## 2019-06-25 DIAGNOSIS — G35 Multiple sclerosis: Secondary | ICD-10-CM | POA: Insufficient documentation

## 2019-06-25 DIAGNOSIS — Z79899 Other long term (current) drug therapy: Secondary | ICD-10-CM | POA: Insufficient documentation

## 2019-06-25 DIAGNOSIS — R202 Paresthesia of skin: Secondary | ICD-10-CM | POA: Diagnosis not present

## 2019-06-25 NOTE — Telephone Encounter (Signed)
Placed [facility] lab in [facility] lock box for routine lab pick up. Results pending. 

## 2019-06-25 NOTE — Progress Notes (Addendum)
[facility]  PATIENT: Jacob Wong DOB: 11-06-1985  REFERRING DOCTOR OR PCP:  Janne Lab, NP SOURCE: Patient, notes from hospital admission, imaging and lab reports, MRI images personally reviewed.  _________________________________   HISTORICAL  CHIEF COMPLAINT:  Chief Complaint  Patient presents with  . New Patient (Initial Visit)    RM 13, alone. ER referral from [facility]. Was at [facility] 06/02/19-06/06/19. Went in with left sided numbness/spasms/weakness on left side. Received 5 days IV steroids. Found to have low vit D. Completed MRI brain/cervical/thoracic while there. Felt better after 4-5 days after being discharged. Feels like Jacob Wong is going to have episode of cramping/tingling in left hand/arm sometimes still but the sensation goes away. Jacob Wong has no family hx of MS that Jacob Wong knows of. Jacob Wong wears contacts (R 20/30, L 20/30, B 20/20).    HISTORY OF PRESENT ILLNESS:  I had the pleasure seeing your patient, Jacob Wong, at the [facility] for neurologic consultation regarding her recent diagnosis of MS.  Jacob Wong is a 34 year old woman who presented to the [facility] emergency room on 06/02/2019 with several days of left sided tingling and cramping.   His hand would cramp into a fist.    Jacob Wong had phasic spasming in the hand for about 15 - 20 seconds, often after standing up.     In the hospital, Jacob Wong had MRI of the brain and cervical spine and an LP.   Jacob Wong received 5 days of IV Solu-Medrol.    Jacob Wong did not improve until a few days after discharge and now feels close to baseline.  In retrospect, Jacob Wong has not had other episodes of neurologic dysfunction.   Jacob Wong has occasional migraine headaches with nausea, phottphobia and phonophobia.  Jacob Wong has about 2/month headaches and 1 every 3 months migraine.     Currently, Jacob Wong feels close to baseline.   Jacob Wong still has a mild sensory alteration in the left arm/hand and left calf but it does not proceed to a cramp/spasm.    Jacob Wong is walking  well with no problems going up or down stairs.    Jacob Wong never had any bladder changes.   Vision has not changed.       Jacob Wong notes some fatigue, more so in the last couple months.    Jacob Wong is sleeping well now.    Jacob Wong denies any issues with mood or cognition.   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   Jacob Wong is out of work right now for a couple weeks.     Data personally reviewed: MRI of the brain 06/02/2019 showed multiple T2/FLAIR hyperintense foci in the hemispheres and right lateral thalamus.  The thalamic/IC focus and 3 other foci enhanced after contrast.  The MRI of the cervical spine showed a normal spinal cord.  There was mild to moderate degenerative changes at C3-C4.     While in the hospital Jacob Wong had a lumbar puncture performed.  There were 10 oligoclonal bands and the IgG index was markedly elevated to 2.3.  Vitamin D was mildly low.  HIV and the neuromyelitis optica antibody was negative.  Jacob Wong has no FH of MS but does not know all his relatives.      REVIEW OF SYSTEMS: Constitutional: No fevers, chills, sweats, or change in appetite Eyes: No visual changes, double vision, eye pain Ear, nose and throat: No hearing loss, ear pain, nasal congestion, sore throat Cardiovascular: No chest pain, palpitations Respiratory: No shortness of breath at rest or with  exertion.   No wheezes GastrointestinaI: No nausea, vomiting, diarrhea, abdominal pain, fecal incontinence Genitourinary: No dysuria, urinary retention or frequency.  No nocturia. Musculoskeletal: No neck pain, back pain Integumentary: No rash, pruritus, skin lesions Neurological: as above Psychiatric: No depression at this time.  No anxiety Endocrine: No palpitations, diaphoresis, change in appetite, change in weigh or increased thirst Hematologic/Lymphatic: No anemia, purpura, petechiae. Allergic/Immunologic: No itchy/runny eyes, nasal congestion, recent allergic reactions, rashes  ALLERGIES: Allergies  Allergen Reactions  . Amoxicillin Rash      Did it involve swelling of the face/tongue/throat, SOB, or low BP? No Did it involve sudden or severe rash/hives, skin peeling, or any reaction on the inside of your mouth or nose? Yes Did you need to seek medical attention at a hospital or doctor's office? Yes When did it last happen?Pt was a child If all above answers are "NO", may proceed with cephalosporin use.     HOME MEDICATIONS:  Current Outpatient Medications:  Marland Kitchen  Vitamin D, Ergocalciferol, (DRISDOL) 1.25 MG (50000 UNIT) CAPS capsule, Take 1 capsule (50,000 Units total) by mouth every 7 (seven) days., Disp: 5 capsule, Rfl: 3  PAST MEDICAL HISTORY: No past medical history on file.  PAST SURGICAL HISTORY: No past surgical history on file.  FAMILY HISTORY: Family History  Problem Relation Age of Onset  . Hypertension Mother   . Hypertension Father     SOCIAL HISTORY:  Social History   Socioeconomic History  . Marital status: Single    Spouse name: Fiance: Regulatory affairs officer  . Number of children: 4  . Years of education: Some college  . Highest education level: Not on file  Occupational History  . Not on file  Tobacco Use  . Smoking status: Never Smoker  . Smokeless tobacco: Never Used  Substance and Sexual Activity  . Alcohol use: Yes    Comment: socially  . Drug use: Never  . Sexual activity: Not on file  Other Topics Concern  . Not on file  Social History Narrative   Coffee/energy drinks sometimes   Lives with fiance and 4 children   Right handed    Social Determinants of Health   Financial Resource Strain:   . Difficulty of Paying Living Expenses:   Food Insecurity:   . Worried About Programme researcher, broadcasting/film/video in the Last Year:   . Barista in the Last Year:   Transportation Needs:   . Freight forwarder (Medical):   Marland Kitchen Lack of Transportation (Non-Medical):   Physical Activity:   . Days of Exercise per Week:   . Minutes of Exercise per Session:   Stress:   . Feeling of Stress :    Social Connections:   . Frequency of Communication with Friends and Family:   . Frequency of Social Gatherings with Friends and Family:   . Attends Religious Services:   . Active Member of Clubs or Organizations:   . Attends Banker Meetings:   Marland Kitchen Marital Status:   Intimate Partner Violence:   . Fear of Current or Ex-Partner:   . Emotionally Abused:   Marland Kitchen Physically Abused:   . Sexually Abused:      PHYSICAL EXAM  Vitals:   06/25/19 0902  BP: 117/78  Pulse: 96  Temp: (!) 97.1 F (36.2 C)  SpO2: 98%  Weight: 274 lb 3.2 oz (124.4 kg)  Height: 6\' 2"  (1.88 m)    Body mass index is 35.21 kg/m.   General: The patient is  well-developed and well-nourished and in no acute distress  HEENT:  Head is [place]/AT.  Sclera are anicteric.  Funduscopic exam shows normal optic discs and retinal vessels.  Neck: No carotid bruits are noted.  The neck is nontender.  Cardiovascular: The heart has a regular rate and rhythm with a normal S1 and S2. There were no murmurs, gallops or rubs.    Skin: Extremities are without rash or  edema.   Neurologic Exam  Mental status: The patient is alert and oriented x 3 at the time of the examination. The patient has apparent normal recent and remote memory, with an apparently normal attention span and concentration ability.   Speech is normal.  Cranial nerves: Extraocular movements are full. Pupils are equal, round, and reactive to light and accomodation.  Color vision was symmetric.  Facial symmetry is present. There is good facial sensation to soft touch bilaterally.Facial strength is normal.  Trapezius and sternocleidomastoid strength is normal. No dysarthria is noted.  The tongue is midline, and the patient has symmetric elevation of the soft palate. No obvious hearing deficits are noted.  Motor:  Muscle bulk is normal.   Tone is normal. Strength is  5 / 5 in all 4 extremities.   Sensory: Sensory testing is intact to pinprick, soft touch and  vibration sensation in all 4 extremities.  Coordination: Cerebellar testing reveals good finger-nose-finger and heel-to-shin bilaterally.  Gait and station: Station is normal.   Gait is normal. Tandem gait is normal. Romberg is negative.   Reflexes: Deep tendon reflexes are symmetric and normal bilaterally.   Plantar responses are flexor.    DIAGNOSTIC DATA (LABS, IMAGING, TESTING) - I reviewed patient records, labs, notes, testing and imaging myself where available.  Lab Results  Component Value Date   WBC 8.9 06/12/2019   HGB 14.4 06/12/2019   HCT 42.6 06/12/2019   MCV 94 06/12/2019   PLT 211 06/12/2019      Component Value Date/Time   NA 141 06/12/2019 1134   K 4.4 06/12/2019 1134   CL 103 06/12/2019 1134   CO2 24 06/12/2019 1134   GLUCOSE 86 06/12/2019 1134   GLUCOSE 100 (H) 06/02/2019 1144   BUN 9 06/12/2019 1134   CREATININE 0.90 06/12/2019 1134   CALCIUM 9.1 06/12/2019 1134   PROT 6.7 06/12/2019 1134   ALBUMIN 4.1 06/12/2019 1134   AST 15 06/12/2019 1134   ALT 29 06/12/2019 1134   ALKPHOS 72 06/12/2019 1134   BILITOT 0.4 06/12/2019 1134   GFRNONAA 111 06/12/2019 1134   GFRAA 128 06/12/2019 1134   Lab Results  Component Value Date   CHOL 196 06/12/2019   HDL 60 06/12/2019   LDLCALC 122 (H) 06/12/2019   TRIG 78 06/12/2019   CHOLHDL 3.3 06/12/2019   Lab Results  Component Value Date   HGBA1C 4.9 06/02/2019   Lab Results  Component Value Date   VITAMINB12 661 06/12/2019   Lab Results  Component Value Date   TSH 2.050 06/12/2019       ASSESSMENT AND PLAN  Multiple sclerosis (HCC) - Plan: Stratify JCV Antibody Test ([facility]), CYP2C9 Genotyping Siponimod, Varicella zoster antibody, IgG  Arm paresthesia, left  High risk medication use - Plan: Stratify JCV Antibody Test ([facility]), CYP2C9 Genotyping Siponimod, Varicella zoster antibody, IgG    In summary, Jacob Wong is a 34 year old man who presented with left-sided sensory symptoms and phasic  spasms who had an MRI of the brain compatible with MS.  Jacob Wong had 4 enhancing lesions  including 1 in the right lateral thalamus/internal capsule that likely explain his symptoms.  Jacob Wong meets the McDonald criteria for multiple sclerosis.  We discussed various treatment options.  Jacob Wong is presenting with an average level of aggressiveness.  After presenting several options Jacob Wong decided to go on one of the S1 P receptor modulators such as Zeposia or Mayzent.  We will check additional blood work for these 2 treatments and EKG. we also discussed various aspects of MS and symptoms that may occur during an exacerbation.  We also discussed that his children will have an approximately 1/30 - 1/50 chance of MS.   His vitamin D was mildly low in the hospital and Jacob Wong is advised to take 5000 units daily as a supplement.  Once Jacob Wong gets started on his medication, Jacob Wong may return to work.  Jacob Wong will return to see me in 2 to 3 months or sooner for new or worsening neurologic symptoms.  Thank you for asking me to see Jacob Wong.  Please let me know if I can be of further assistance with him or other patients in the future.  Lucienne Sawyers A. Felecia Shelling, MD, [facility] 06/22/8784, 7:67 AM Certified in Neurology, Clinical Neurophysiology, Sleep Medicine and Neuroimaging  [facility] [address], [place], [place] [address] [phone]

## 2019-06-27 ENCOUNTER — Ambulatory Visit (INDEPENDENT_AMBULATORY_CARE_PROVIDER_SITE_OTHER): Payer: BC Managed Care – PPO | Admitting: Adult Health Nurse Practitioner

## 2019-06-27 ENCOUNTER — Other Ambulatory Visit: Payer: Self-pay

## 2019-06-27 ENCOUNTER — Encounter: Payer: Self-pay | Admitting: Adult Health Nurse Practitioner

## 2019-06-27 VITALS — BP 138/85 | HR 82 | Temp 97.6°F | Resp 16 | Ht 74.0 in | Wt 274.0 lb

## 2019-06-27 DIAGNOSIS — G35 Multiple sclerosis: Secondary | ICD-10-CM | POA: Diagnosis not present

## 2019-06-27 NOTE — Progress Notes (Addendum)
  Chief Complaint  Patient presents with  . Follow-up    x15 days on medical conditions    HPI  Patient presents for follow-up on his most postdialysis diagnosis.  He has recently seen a neurologist.  Discussed a couple of medications how much he can start and the patient is considering.  He is here with paperwork to complete regarding his multiple sclerosis and time away from work.  Still trying to get him in with Dr. Elwyn Reach of [facility] neurology.  He was given the phone number today.  Problem List    Problem List: 2021-04: Multiple sclerosis (HCC) 2021-04: High risk medication use 2021-03: Arm paresthesia, left 2021-03: Demyelinating disease (HCC)   Allergies   is allergic to amoxicillin.  Medications    Current Outpatient Medications:  Marland Kitchen  Vitamin D, Ergocalciferol, (DRISDOL) 1.25 MG (50000 UNIT) CAPS capsule, Take 1 capsule (50,000 Units total) by mouth every 7 (seven) days., Disp: 5 capsule, Rfl: 3   Review of Systems    Constitutional: Negative for activity change, appetite change, chills and fever.  HENT: Negative for congestion, nosebleeds, trouble swallowing and voice change.   Respiratory: Negative for cough, shortness of breath and wheezing.   Cardiac:  Negative for chest pain, pressure, syncope  Gastrointestinal: Negative for diarrhea, nausea and vomiting.  Genitourinary: Negative for difficulty urinating, dysuria, flank pain and hematuria.  Musculoskeletal: Negative for back pain, joint swelling and neck pain.  Neurological: No MS symptoms currently.  See HPI. All other review of systems negative.     Physical Exam:    height is 6\' 2"  (1.88 m) and weight is 274 lb (124.3 kg). His temporal temperature is 97.6 F (36.4 C). His blood pressure is 138/85 and his pulse is 82. His respiration is 16 and oxygen saturation is 97%.   Physical Examination: General appearance - alert, well appearing, and in no distress and oriented to person, place, and time Mental status -  normal mood, behavior, speech, dress, motor activity, and thought processes Eyes - PERRL. Extraocular movements intact.  No nystagmus.  Neck - supple, no significant adenopathy, carotids upstroke normal bilaterally, no bruits, thyroid exam: thyroid is normal in size without nodules or tenderness Chest - clear to auscultation, no wheezes, rales or rhonchi, symmetric air entry  Heart - normal rate, regular rhythm, normal S1, S2, no murmurs, rubs, clicks or gallops Extremities - dependent LE edema without clubbing or cyanosis Skin - normal coloration and turgor, no rashes, no suspicious skin lesions noted  No hyperpigmentation of skin.  No current hematomas noted   Labs:  Lab work was normal from a winter last week.  Assessment & Plan:  Jasmeet Gehl is a 34 y.o. male    1. Multiple sclerosis (HCC)    Pleated paperwork the patient.  We will follow-up with him following initiation of his first dose of medication for MS.  He is in line with this plan.  20, NP

## 2019-06-27 NOTE — Patient Instructions (Signed)
° ° ° °  If you have lab work done today you will be contacted with your lab results within the next 2 weeks.  If you have not heard from us then please contact us. The fastest way to get your results is to register for My Chart. ° ° °IF you received an x-ray today, you will receive an invoice from [facility]. Please contact [facility] at [phone] with questions or concerns regarding your invoice.  ° °IF you received labwork today, you will receive an invoice from [facility]. Please contact [facility] at [phone] with questions or concerns regarding your invoice.  ° °Our billing staff will not be able to assist you with questions regarding bills from these companies. ° °You will be contacted with the lab results as soon as they are available. The fastest way to get your results is to activate your My Chart account. Instructions are located on the last page of this paperwork. If you have not heard from us regarding the results in 2 weeks, please contact this office. °  ° ° ° °

## 2019-07-01 ENCOUNTER — Telehealth: Payer: Self-pay | Admitting: *Deleted

## 2019-07-01 LAB — VARICELLA ZOSTER ANTIBODY, IGG: Varicella zoster IgG: >4000 index (ref >165)

## 2019-07-01 LAB — CYP2C9 GENOTYPING SIPONIMOD

## 2019-07-01 NOTE — Telephone Encounter (Signed)
Called pt. Advised per Dr. Epimenio Foot that he can proceed with either Mayzent or Zeposia. Last eye exam over a year ago. He will proceed with Mayzent and will do eye exam to screen for macular edema with their in home screening program. Advised his genotype labs shows he can go on regular dose of 2mg  po qd. He will get a starter pack to titrate up to this once he has been cleared for therapy. He verbalized understanding.  Faxed completed/signed Mayzent start form to [phone]. Received fax confirmation.

## 2019-07-01 NOTE — Addendum Note (Signed)
Addended by: Arther Abbott on: 07/01/2019 04:24 PM   Modules accepted: Orders

## 2019-07-01 NOTE — Telephone Encounter (Addendum)
I called pt back. He feels more comfortable with doing Zeposia over Mayzent based on material Dr. Epimenio Foot provided him. Advised I will call and cx Mayzent start form we sent in and then send in start form for Zeposia. Advised I will call him back if anything needed in the future. He verbalized understanding.   Called Mayzent 360 support [phone]. Spoke with Selena Batten. Relayed pt not proceeding with Mayzent and asked case be cx. She will forward to case manager to have them cx case out. Nothing further needed.   Faxed completed/signed Zeposia start form to 360 support at [phone]. Received fax confirmation.

## 2019-07-01 NOTE — Telephone Encounter (Signed)
Pt called to inform he would like to move forward with the zeposia and would like to know if he could be referred to an eye dr. For his eye exam

## 2019-07-03 NOTE — Telephone Encounter (Addendum)
Submitted PA Zeposia starter pack on CMM. Key: [id number]. Waiting on determination from [employer].  Received fax from [employer] that start pack approved 07/03/19-07/02/20. PA# [employer] [id number].   Submitted PA Zeposia 0.92mg  capsule (maintenance dose) on CMM. [id number].  Received the following response: "Your PA has been resolved, no additional PA is required. For further inquiries please contact the number on the back of the member prescription card. (Message [phone])"

## 2019-07-08 NOTE — Telephone Encounter (Signed)
[email or web address] called for the status of the PA for pt's Zeposia.  Message from [facility] was relayed to him.  No call back requested

## 2019-07-08 NOTE — Telephone Encounter (Signed)
JCV ab drawn on 06/25/19 indeterminate, index: 0.29. Inhibition assay: negative

## 2019-07-11 ENCOUNTER — Encounter: Payer: Self-pay | Admitting: Adult Health Nurse Practitioner

## 2019-07-11 ENCOUNTER — Ambulatory Visit (INDEPENDENT_AMBULATORY_CARE_PROVIDER_SITE_OTHER): Payer: BC Managed Care – PPO | Admitting: Adult Health Nurse Practitioner

## 2019-07-11 ENCOUNTER — Other Ambulatory Visit: Payer: Self-pay

## 2019-07-11 VITALS — BP 138/87 | HR 90 | Temp 98.0°F | Ht 74.0 in | Wt 277.0 lb

## 2019-07-11 DIAGNOSIS — Z0289 Encounter for other administrative examinations: Secondary | ICD-10-CM

## 2019-07-11 DIAGNOSIS — G35 Multiple sclerosis: Secondary | ICD-10-CM

## 2019-07-11 DIAGNOSIS — Z79899 Other long term (current) drug therapy: Secondary | ICD-10-CM

## 2019-07-11 NOTE — Patient Instructions (Signed)
° ° ° °  If you have lab work done today you will be contacted with your lab results within the next 2 weeks.  If you have not heard from us then please contact us. The fastest way to get your results is to register for My Chart. ° ° °IF you received an x-ray today, you will receive an invoice from [facility]. Please contact [facility] at [phone] with questions or concerns regarding your invoice.  ° °IF you received labwork today, you will receive an invoice from [facility]. Please contact [facility] at [phone] with questions or concerns regarding your invoice.  ° °Our billing staff will not be able to assist you with questions regarding bills from these companies. ° °You will be contacted with the lab results as soon as they are available. The fastest way to get your results is to activate your My Chart account. Instructions are located on the last page of this paperwork. If you have not heard from us regarding the results in 2 weeks, please contact this office. °  ° ° ° °

## 2019-07-15 NOTE — Telephone Encounter (Signed)
Called Josh with [employer]ep[employer] 360 support to get an update. Verified they see PA approved and he has been cleared. He reviewed notes on pt profile. States the nurse reached out to the pt. Waiting on pt to be triaged to receive his medication. States the triage process has not been started yet. He placed me on hold to speak with nurse to see where things are at. States they are waiting on pt to be triaged. Next call to pt scheduled for this Friday. This is when they will get shipment of medication set up with pt. Nothing further needed at this time. Call back [phone] ext [phone]

## 2019-07-17 DIAGNOSIS — Z0289 Encounter for other administrative examinations: Secondary | ICD-10-CM | POA: Insufficient documentation

## 2019-07-17 DIAGNOSIS — G35 Multiple sclerosis: Secondary | ICD-10-CM | POA: Diagnosis not present

## 2019-07-17 NOTE — Progress Notes (Signed)
  History   Chief Complaint  Patient presents with  . Follow-up    x2 weeks    HPI   Jacob Wong  Is following up on his paperwork for work.  Currently waiting on the specialized medication to come in from Neurology to start for his MS.  No symptoms since last seen.  He did get a call from Dr. Rosemarie Wong office at [facility] who is working with him on getting an appointment for a second opinion.     History reviewed. No pertinent past medical history. History reviewed. No pertinent surgical history. Family History  Problem Relation Age of Onset  . Hypertension Mother   . Hypertension Father    Social History   Tobacco Use  . Smoking status: Never Smoker  . Smokeless tobacco: Never Used  Substance Use Topics  . Alcohol use: Yes    Comment: socially  . Drug use: Never    Review of Systems   Review of Systems See HPI Constitution: No fevers or chills No malaise No diaphoresis Skin: No rash or itching Eyes: no blurry vision, no double vision GU: no dysuria or hematuria Neuro: no dizziness or headaches   Allergies   Amoxicillin  Home Medications    Current Outpatient Medications:  .  Ozanimod HCl (ZEPOSIA) 0.92 MG CAPS, Take by mouth., Disp: , Rfl:  .  Ozanimod HCl Starter Pack (ZEPOSIA STARTER KIT) 0.23MG & 0.46MG & 0.92MG CPPK, Take by mouth., Disp: , Rfl:   Meds Ordered and Administered this Visit  No orders of the defined types were placed in this encounter.   BP 138/87 (BP Location: Right Arm, Patient Position: Sitting, Cuff Size: Large)   Pulse 90   Temp 98 F (36.7 C) (Temporal)   Ht _0  (1.88 m)   Wt 277 lb (125.6 kg)   SpO2 96%   BMI 35.56 kg/m   Physical Exam  General appearance: alert, well appearing, and in no distress, oriented to person, place, and time and anxious. Chest: clear to auscultation, no wheezes, rales or rhonchi, symmetric air entry.  CVS exam: normal rate, regular rhythm, normal S1, S2, no murmurs, rubs, clicks or  gallops. Skin exam - normal coloration and turgor, no rashes, no suspicious skin lesions noted. Mental Status: normal mood, behavior, speech, dress, motor activity, and thought processes, anxious.   MDM    1. Encounter for completion of form with patient   2. Multiple sclerosis ([place])   3. High risk medication use    No orders of the defined types were placed in this encounter.    Will f/u as needed.  Will complete forms for work as long as he needs.  Patient to f/u after seeing Dr. Moshe Wong at [facility].  He is inline with this plan.   Jacob Ade, NP

## 2019-07-22 NOTE — Progress Notes (Signed)
Chief Complaint  Patient presents with  . Establish Care    Pt was admitted in Hospital on 3/22-3/26 and was Dx with MS. He is still c/o tingling and cramping in his fingers/hands since.    HPI   Patient was recently admitted to the hospital with numbness, tingling, cramping in arms thinking it was a stroke and he was diagnosed with MS.  He presents today for evaluation. Needs work forms completed.   Problem List    Problem List: 2021-05: Encounter for completion of form with patient 2021-04: Multiple sclerosis ([place]) 2021-04: High risk medication use 2021-03: Arm paresthesia, left 2021-03: Demyelinating disease ([place])   Allergies   is allergic to amoxicillin.  Medications    Current Outpatient Medications:  .  Ozanimod HCl (ZEPOSIA) 0.92 MG CAPS, Take by mouth., Disp: , Rfl:  .  Ozanimod HCl Starter Pack (ZEPOSIA STARTER KIT) 0.23MG & 0.46MG & 0.92MG CPPK, Take by mouth., Disp: , Rfl:    Review of Systems    Constitutional: Negative for activity change, appetite change, chills and fever.  HENT: Negative for congestion, nosebleeds, trouble swallowing and voice change.   Respiratory: Negative for cough, shortness of breath and wheezing.   Cardiac:  Negative for chest pain, pressure, syncope  Gastrointestinal: Negative for diarrhea, nausea and vomiting.  Genitourinary: Negative for difficulty urinating, dysuria, flank pain and hematuria.  Musculoskeletal: Negative for back pain, joint swelling and neck pain.  Neurological: Negative for dizziness, speech difficulty, light-headedness and numbness.  See HPI. All other review of systems negative.     Physical Exam:    height is _0  (1.88 m) and weight is 275 lb 9.6 oz (125 kg). His temporal temperature is 97.8 F (36.6 C). His blood pressure is 143/90 (abnormal) and his pulse is 98. His oxygen saturation is 99%.   Physical Examination: General appearance - alert, well appearing, and in no distress and oriented to person,  place, and time Mental status - normal mood, behavior, speech, dress, motor activity, and thought processes Eyes - PERRL. Extraocular movements intact.  No nystagmus.  Neck - supple, no significant adenopathy, carotids upstroke normal bilaterally, no bruits, thyroid exam: thyroid is normal in size without nodules or tenderness Chest - clear to auscultation, no wheezes, rales or rhonchi, symmetric air entry  Heart - normal rate, regular rhythm, normal S1, S2, no murmurs, rubs, clicks or gallops Extremities - dependent LE edema without clubbing or cyanosis Skin - normal coloration and turgor, no rashes, no suspicious skin lesions noted  No hyperpigmentation of skin.  No current hematomas noted   Lab /Imaging Review    Labs reviewed.   Assessment & Plan:  Jacob Wong is a 34 y.o. male    1. Elevated glucose   2. Demyelinating disease ([place])   3. Establishing care with new doctor, encounter for   4. Need for prophylactic vaccination with combined diphtheria-tetanus-pertussis (DTP) vaccine   5. Arm paresthesia, left    Orders Placed This Encounter  Procedures  . Tdap vaccine greater than or equal to 7yo IM  . CMP14+EGFR  . Lipid panel  . B12 and Folate Panel  . Magnesium  . CBC with Differential/Platelet  . Iron, TIBC and Ferritin Panel  . Thyroid Panel With TSH  . TSH  . POCT urinalysis dipstick  . POCT glucose (manual entry)   Meds ordered this encounter  Medications  . Vitamin D, Ergocalciferol, (DRISDOL) 1.25 MG (50000 UNIT) CAPS capsule    Sig: Take 1 capsule (50,000  Units total) by mouth every 7 (seven) days.    Dispense:  5 capsule    Refill:  3   Followed up on labs.  He has an appointment with neurology and we discussed getting an appointment with Dr. Moshe Cipro at [facility].  He is interested in that.  I have reached out to him and am working on an appointment for a second opinion.  Will f/u in 1 week.  He is inline with this plan.   Glyn Ade, NP   Glyn Ade,  NP

## 2019-07-28 NOTE — Telephone Encounter (Signed)
I called pt. No answer, left a message asking him to call me back. I was calling to find out if pt received his shipment of zeposia and has started it.

## 2019-07-30 ENCOUNTER — Telehealth: Payer: Self-pay

## 2019-07-30 NOTE — Telephone Encounter (Signed)
Pt called on yesterday 07/29/2019 requesting provider change return to work date to 08/04/2019.  Tried contacting pt via phone at [phone] with no answer and voicemail full and unable to leave a message.  If pt calls back he may need to reach out to Neurologist to change return to work date as Janne Lab is no longer practicing at [employer].

## 2019-08-01 DIAGNOSIS — G35 Multiple sclerosis: Secondary | ICD-10-CM | POA: Diagnosis not present

## 2019-08-01 NOTE — Telephone Encounter (Signed)
Pt's fiance called back and is requesting a call back because she wants a provider at [place] to sign his documents. Please advise  Best contact: [phone]

## 2019-08-04 NOTE — Telephone Encounter (Signed)
Spoke with fiance and she advises that pt was able to get things taken care of on Friday re: return to work date.  Advised to call if any further assistance needed.

## 2019-08-07 ENCOUNTER — Other Ambulatory Visit: Payer: Self-pay | Admitting: Neurology

## 2019-08-07 NOTE — Telephone Encounter (Signed)
Called, LVM for pt to call office. 

## 2019-08-07 NOTE — Telephone Encounter (Signed)
Called, LVM for pt to call office. Wanting to discuss further

## 2019-08-12 ENCOUNTER — Ambulatory Visit: Payer: BC Managed Care – PPO | Admitting: Adult Health Nurse Practitioner

## 2019-09-24 ENCOUNTER — Other Ambulatory Visit: Payer: Self-pay

## 2019-09-24 ENCOUNTER — Encounter: Payer: Self-pay | Admitting: Neurology

## 2019-09-24 ENCOUNTER — Ambulatory Visit (INDEPENDENT_AMBULATORY_CARE_PROVIDER_SITE_OTHER): Payer: BC Managed Care – PPO | Admitting: Neurology

## 2019-09-24 VITALS — BP 150/91 | HR 94 | Ht 74.0 in | Wt 282.0 lb

## 2019-09-24 DIAGNOSIS — G35 Multiple sclerosis: Secondary | ICD-10-CM | POA: Diagnosis not present

## 2019-09-24 DIAGNOSIS — Z79899 Other long term (current) drug therapy: Secondary | ICD-10-CM | POA: Diagnosis not present

## 2019-09-24 DIAGNOSIS — G43009 Migraine without aura, not intractable, without status migrainosus: Secondary | ICD-10-CM | POA: Diagnosis not present

## 2019-09-24 NOTE — Progress Notes (Signed)
[facility]  PATIENT: Jacob Wong DOB: May 12, 1985  REFERRING DOCTOR OR PCP:  Janne Lab, NP SOURCE: Patient, notes from hospital admission, imaging and lab reports, MRI images personally reviewed.  _________________________________   HISTORICAL  CHIEF COMPLAINT:  Chief Complaint  Patient presents with  . Follow-up    Rm 12, alone. Last seen 06/25/19  . Multiple Sclerosis    On Zeposia, maintenance dose and tolerating well.    HISTORY OF PRESENT ILLNESS: Jacob Wong a 34 y.o. man with relapsing remitting  MS.  Update 09/24/2019: At his initial visit in April 2021, we confirmed the diagnosis of multiple sclerosis and he was started on Zeposia.  He had no difficulty with the titration and is tolerating it well.  He denies any MS exacerbations or any significant new neurologic symptoms.  He is walking well and has no trouble with stairs.   Strength and sensation are normal in his limbs.    He has no more episodes of phasic spasms in the left arm and denies any spasticity.  No ataxia.     Bladder function is fine.   Vision is doing well.  He notes some fatigue at times.  However, he does work third shift as a Location manager.  On his days off he tries to align his schedule with others more.   He is sleeping well now.    He denies any issues with mood or cognition.    .   He has occasional migraine headaches with nausea, phottphobia and phonophobia.  He has about 2/month headaches and 1 every 3 months migraine.     He is on Voit D 50000 U weekly (level was 20.5 in March)  MS History (obtained 06/25/2019): He presented to the [facility] emergency room on 06/02/2019 with several days of left sided tingling and cramping.   His hand would cramp into a fist.    He had phasic spasming in the hand for about 15 - 20 seconds, often after standing up.     In the hospital, he had MRI of the brain and cervical spine and an LP.   He received 5 days of IV Solu-Medrol.    He  did not improve until a few days after discharge and now feels close to baseline.   In retrospect, he has not had other episodes of neurologic dysfunction.     Data reviewed: MRI of the brain 06/02/2019 showed multiple T2/FLAIR hyperintense foci in the hemispheres and right lateral thalamus.  The thalamic/IC focus and 3 other foci enhanced after contrast.  The MRI of the cervical spine showed a normal spinal cord.  There was mild to moderate degenerative changes at C3-C4.     MRI of the thoracic spine 06/04/2019 showed 1 focus anteromedially adjacent to T7  Labs:  While in the hospital he had a lumbar puncture performed.  There were 10 oligoclonal bands and the IgG index was markedly elevated to 2.3.  Vitamin D was mildly low.  HIV and the neuromyelitis optica antibody was negative.  He has no FH of MS but does not know all his relatives.      REVIEW OF SYSTEMS: Constitutional: No fevers, chills, sweats, or change in appetite Eyes: No visual changes, double vision, eye pain Ear, nose and throat: No hearing loss, ear pain, nasal congestion, sore throat Cardiovascular: No chest pain, palpitations Respiratory: No shortness of breath at rest or with exertion.   No wheezes GastrointestinaI: No nausea, vomiting, diarrhea, abdominal pain,  fecal incontinence Genitourinary: No dysuria, urinary retention or frequency.  No nocturia. Musculoskeletal: No neck pain, back pain Integumentary: No rash, pruritus, skin lesions Neurological: as above Psychiatric: No depression at this time.  No anxiety Endocrine: No palpitations, diaphoresis, change in appetite, change in weigh or increased thirst Hematologic/Lymphatic: No anemia, purpura, petechiae. Allergic/Immunologic: No itchy/runny eyes, nasal congestion, recent allergic reactions, rashes  ALLERGIES: Allergies  Allergen Reactions  . Amoxicillin Rash    Did it involve swelling of the face/tongue/throat, SOB, or low BP? No Did it involve sudden or  severe rash/hives, skin peeling, or any reaction on the inside of your mouth or nose? Yes Did you need to seek medical attention at a hospital or doctor's office? Yes When did it last happen?Pt was a child If all above answers are "NO", may proceed with cephalosporin use.     HOME MEDICATIONS:  Current Outpatient Medications:  .  Ozanimod HCl (ZEPOSIA) 0.92 MG CAPS, Take by mouth., Disp: , Rfl:   PAST MEDICAL HISTORY: No past medical history on file.  PAST SURGICAL HISTORY: No past surgical history on file.  FAMILY HISTORY: Family History  Problem Relation Age of Onset  . Hypertension Mother   . Hypertension Father     SOCIAL HISTORY:  Social History   Socioeconomic History  . Marital status: Single    Spouse name: Fiance: Regulatory affairs officer  . Number of children: 4  . Years of education: Some college  . Highest education level: Not on file  Occupational History  . Not on file  Tobacco Use  . Smoking status: Never Smoker  . Smokeless tobacco: Never Used  Substance and Sexual Activity  . Alcohol use: Yes    Comment: socially  . Drug use: Never  . Sexual activity: Not on file  Other Topics Concern  . Not on file  Social History Narrative   Coffee/energy drinks sometimes   Lives with fiance and 4 children   Right handed    Social Determinants of Health   Financial Resource Strain:   . Difficulty of Paying Living Expenses:   Food Insecurity:   . Worried About Programme researcher, broadcasting/film/video in the Last Year:   . Barista in the Last Year:   Transportation Needs:   . Freight forwarder (Medical):   Marland Kitchen Lack of Transportation (Non-Medical):   Physical Activity:   . Days of Exercise per Week:   . Minutes of Exercise per Session:   Stress:   . Feeling of Stress :   Social Connections:   . Frequency of Communication with Friends and Family:   . Frequency of Social Gatherings with Friends and Family:   . Attends Religious Services:   . Active Member of  Clubs or Organizations:   . Attends Banker Meetings:   Marland Kitchen Marital Status:   Intimate Partner Violence:   . Fear of Current or Ex-Partner:   . Emotionally Abused:   Marland Kitchen Physically Abused:   . Sexually Abused:      PHYSICAL EXAM  Vitals:   09/24/19 0835  BP: (!) 150/91  Pulse: 94  Weight: 282 lb (127.9 kg)  Height: 6\' 2"  (1.88 m)    Body mass index is 36.21 kg/m.   General: The patient is well-developed and well-nourished and in no acute distress  HEENT:  Head is [place]/AT.  Sclera are anicteric.   Skin: Extremities are without rash or  edema.  Neurologic Exam  Mental status: The patient is alert  and oriented x 3 at the time of the examination. The patient has apparent normal recent and remote memory, with an apparently normal attention span and concentration ability.   Speech is normal.  Cranial nerves: Extraocular movements are full . Color vision was symmetric.  Facial symmetry is present.  Facial strength is normal.  Trapezius and sternocleidomastoid strength is normal. No dysarthria is noted.   No obvious hearing deficits are noted.  Motor:  Muscle bulk is normal.   Tone is normal. Strength is  5 / 5 in all 4 extremities.   Sensory: Sensory testing is intact to pinprick, soft touch and vibration sensation in all 4 extremities.  Coordination: Cerebellar testing reveals good finger-nose-finger and heel-to-shin bilaterally.  Gait and station: Station is normal.   Gait is normal. Tandem gait is normal. Romberg is negative.   Reflexes: Deep tendon reflexes are symmetric and normal bilaterally.   Plantar responses are flexor.    DIAGNOSTIC DATA (LABS, IMAGING, TESTING) - I reviewed patient records, labs, notes, testing and imaging myself where available.  Lab Results  Component Value Date   WBC 8.9 06/12/2019   HGB 14.4 06/12/2019   HCT 42.6 06/12/2019   MCV 94 06/12/2019   PLT 211 06/12/2019      Component Value Date/Time   NA 141 06/12/2019 1134    K 4.4 06/12/2019 1134   CL 103 06/12/2019 1134   CO2 24 06/12/2019 1134   GLUCOSE 86 06/12/2019 1134   GLUCOSE 100 (H) 06/02/2019 1144   BUN 9 06/12/2019 1134   CREATININE 0.90 06/12/2019 1134   CALCIUM 9.1 06/12/2019 1134   PROT 6.7 06/12/2019 1134   ALBUMIN 4.1 06/12/2019 1134   AST 15 06/12/2019 1134   ALT 29 06/12/2019 1134   ALKPHOS 72 06/12/2019 1134   BILITOT 0.4 06/12/2019 1134   GFRNONAA 111 06/12/2019 1134   GFRAA 128 06/12/2019 1134   Lab Results  Component Value Date   CHOL 196 06/12/2019   HDL 60 06/12/2019   LDLCALC 122 (H) 06/12/2019   TRIG 78 06/12/2019   CHOLHDL 3.3 06/12/2019   Lab Results  Component Value Date   HGBA1C 4.9 06/02/2019   Lab Results  Component Value Date   VITAMINB12 661 06/12/2019   Lab Results  Component Value Date   TSH 2.050 06/12/2019       ASSESSMENT AND PLAN  Multiple sclerosis (HCC) - Plan: CBC with Differential/Platelet, Hepatic function panel  High risk medication use - Plan: CBC with Differential/Platelet, Hepatic function panel  Migraine without aura and without status migrainosus, not intractable   1.   Continue use of Zeposia.  We will check some lab work today.  When he returns for his next visit in 6 months we will recheck the lab work and also check an MRI of the brain to determine if there is any subclinical progression.   2.   Stay active and exercise. 3.   Return in 6 months or sooner if there are new or worsening neurologic symptoms.  Lateasha Breuer A. Epimenio Foot, MD, [facility] 09/24/2019, 9:18 AM Certified in Neurology, Clinical Neurophysiology, Sleep Medicine and Neuroimaging  [facility] [address] [place], [place] [address] [phone]

## 2019-09-25 LAB — HEPATIC FUNCTION PANEL
ALT: 31 IU/L (ref 0–44)
AST: 17 IU/L (ref 0–40)
Albumin: 3.9 g/dL — ABNORMAL LOW (ref 4.0–5.0)
Alkaline Phosphatase: 86 IU/L (ref 48–121)
Bilirubin Total: 0.3 mg/dL (ref 0.0–1.2)
Bilirubin, Direct: 0.09 mg/dL (ref 0.00–0.40)
Total Protein: 6.5 g/dL (ref 6.0–8.5)

## 2019-09-25 LAB — CBC WITH DIFFERENTIAL/PLATELET
Basophils Absolute: 0 10*3/uL (ref 0.0–0.2)
Basos: 1 %
EOS (ABSOLUTE): 0.1 10*3/uL (ref 0.0–0.4)
Eos: 3 %
Hematocrit: 41.6 % (ref 37.5–51.0)
Hemoglobin: 14.2 g/dL (ref 13.0–17.7)
Immature Grans (Abs): 0 10*3/uL (ref 0.0–0.1)
Immature Granulocytes: 1 %
Lymphocytes Absolute: 0.6 10*3/uL — ABNORMAL LOW (ref 0.7–3.1)
Lymphs: 14 %
MCH: 31.6 pg (ref 26.6–33.0)
MCHC: 34.1 g/dL (ref 31.5–35.7)
MCV: 93 fL (ref 79–97)
Monocytes Absolute: 0.5 10*3/uL (ref 0.1–0.9)
Monocytes: 13 %
Neutrophils Absolute: 2.9 10*3/uL (ref 1.4–7.0)
Neutrophils: 68 %
Platelets: 203 10*3/uL (ref 150–450)
RBC: 4.49 x10E6/uL (ref 4.14–5.80)
RDW: 12 % (ref 11.6–15.4)
WBC: 4.2 10*3/uL (ref 3.4–10.8)

## 2019-12-03 ENCOUNTER — Ambulatory Visit (INDEPENDENT_AMBULATORY_CARE_PROVIDER_SITE_OTHER): Payer: BC Managed Care – PPO

## 2019-12-03 ENCOUNTER — Other Ambulatory Visit: Payer: Self-pay

## 2019-12-03 DIAGNOSIS — Z23 Encounter for immunization: Secondary | ICD-10-CM | POA: Diagnosis not present

## 2019-12-03 NOTE — Progress Notes (Signed)
Patient monitored, tolerated Pfizer #1 well.  Jacob Wong

## 2019-12-19 ENCOUNTER — Other Ambulatory Visit: Payer: BC Managed Care – PPO

## 2020-01-23 DIAGNOSIS — Z23 Encounter for immunization: Secondary | ICD-10-CM | POA: Diagnosis not present

## 2020-03-31 ENCOUNTER — Encounter: Payer: Self-pay | Admitting: Family Medicine

## 2020-03-31 ENCOUNTER — Ambulatory Visit: Payer: BC Managed Care – PPO | Admitting: Family Medicine

## 2020-06-30 ENCOUNTER — Other Ambulatory Visit: Payer: Self-pay

## 2020-06-30 ENCOUNTER — Ambulatory Visit (INDEPENDENT_AMBULATORY_CARE_PROVIDER_SITE_OTHER): Payer: BC Managed Care – PPO | Admitting: Family Medicine

## 2020-06-30 ENCOUNTER — Encounter: Payer: Self-pay | Admitting: Family Medicine

## 2020-06-30 VITALS — BP 144/91 | HR 83 | Ht 74.0 in | Wt 272.0 lb

## 2020-06-30 DIAGNOSIS — Z79899 Other long term (current) drug therapy: Secondary | ICD-10-CM | POA: Diagnosis not present

## 2020-06-30 DIAGNOSIS — G43009 Migraine without aura, not intractable, without status migrainosus: Secondary | ICD-10-CM

## 2020-06-30 DIAGNOSIS — G35 Multiple sclerosis: Secondary | ICD-10-CM | POA: Diagnosis not present

## 2020-06-30 NOTE — Progress Notes (Signed)
Chief Complaint  Patient presents with  . Follow-up    RM 2 with fiance (shaquenia)  Pt is well, things are stable. No new symptoms      HISTORY OF PRESENT ILLNESS: 06/30/20 ALL:  Jacob Wong is a 35 y.o. male here today for follow up for multiple sclerosis. He continues Zeposia. He is tolerating medication well.   He feels he is doing pretty good. No changes in gait. He does feel a little off balance from time to time but not consistently. No falls. He is working full time. He works 12 hour shifts, 2 on, 2 off. He does have some concerns of difficulty remembering what he was thinking about earlier in the day. Otherwise no concerns. He reports migraines are rare. He is sleeping fairly well. Mood is good. No bowel/bladder changes. No vision changes. No numbness/tingling or weakness.    HISTORY (copied from Dr Bonnita Hollow previous note)  Update 09/24/2019: At his initial visit in April 2021, we confirmed the diagnosis of multiple sclerosis and he was started on Zeposia.  He had no difficulty with the titration and is tolerating it well.  He denies any MS exacerbations or any significant new neurologic symptoms.  He is walking well and has no trouble with stairs.   Strength and sensation are normal in his limbs.    He has no more episodes of phasic spasms in the left arm and denies any spasticity.  No ataxia.     Bladder function is fine.   Vision is doing well.  He notes some fatigue at times.  However, he does work third shift as a Location manager.  On his days off he tries to align his schedule with others more.   He is sleeping well now.    He denies any issues with mood or cognition.    .   He has occasional migraine headaches with nausea, phottphobia and phonophobia.  He has about 2/month headaches and 1 every 3 months migraine.     He is on Voit D 50000 U weekly (level was 20.5 in March)  MS History (obtained 06/25/2019): He presented to the [facility] emergency room on  06/02/2019 with several days of left sided tingling and cramping.   His hand would cramp into a fist.    He had phasic spasming in the hand for about 15 - 20 seconds, often after standing up.     In the hospital, he had MRI of the brain and cervical spine and an LP.   He received 5 days of IV Solu-Medrol.    He did not improve until a few days after discharge and now feels close to baseline.   In retrospect, he has not had other episodes of neurologic dysfunction.     Data reviewed: MRI of the brain 06/02/2019 showed multiple T2/FLAIR hyperintense foci in the hemispheres and right lateral thalamus.  The thalamic/IC focus and 3 other foci enhanced after contrast.  The MRI of the cervical spine showed a normal spinal cord.  There was mild to moderate degenerative changes at C3-C4.     MRI of the thoracic spine 06/04/2019 showed 1 focus anteromedially adjacent to T7  Labs:  While in the hospital he had a lumbar puncture performed.  There were 10 oligoclonal bands and the IgG index was markedly elevated to 2.3.  Vitamin D was mildly low.  HIV and the neuromyelitis optica antibody was negative.  He has no FH of MS but does not  know all his relatives.     REVIEW OF SYSTEMS: Out of a complete 14 system review of symptoms, the patient complains only of the following symptoms, headaches, episodic imbalance and all other reviewed systems are negative.    ALLERGIES: Allergies  Allergen Reactions  . Amoxicillin Rash    Did it involve swelling of the face/tongue/throat, SOB, or low BP? No Did it involve sudden or severe rash/hives, skin peeling, or any reaction on the inside of your mouth or nose? Yes Did you need to seek medical attention at a hospital or doctor's office? Yes When did it last happen?Pt was a child If all above answers are "NO", may proceed with cephalosporin use.      HOME MEDICATIONS: Outpatient Medications Prior to Visit  Medication Sig Dispense Refill  . Ozanimod HCl  (ZEPOSIA) 0.92 MG CAPS Take by mouth.     No facility-administered medications prior to visit.     PAST MEDICAL HISTORY: History reviewed. No pertinent past medical history.   PAST SURGICAL HISTORY: History reviewed. No pertinent surgical history.   FAMILY HISTORY: Family History  Problem Relation Age of Onset  . Hypertension Mother   . Hypertension Father      SOCIAL HISTORY: Social History   Socioeconomic History  . Marital status: Single    Spouse name: Fiance: Regulatory affairs officer  . Number of children: 4  . Years of education: Some college  . Highest education level: Not on file  Occupational History  . Not on file  Tobacco Use  . Smoking status: Never Smoker  . Smokeless tobacco: Never Used  Substance and Sexual Activity  . Alcohol use: Yes    Comment: socially  . Drug use: Never  . Sexual activity: Not on file  Other Topics Concern  . Not on file  Social History Narrative   Coffee/energy drinks sometimes   Lives with fiance and 4 children   Right handed    Social Determinants of Health   Financial Resource Strain: Not on file  Food Insecurity: Not on file  Transportation Needs: Not on file  Physical Activity: Not on file  Stress: Not on file  Social Connections: Not on file  Intimate Partner Violence: Not on file      PHYSICAL EXAM  Vitals:   06/30/20 1449  BP: (!) 144/91  Pulse: 83  Weight: 272 lb (123.4 kg)  Height: 6\' 2"  (1.88 m)   Body mass index is 34.92 kg/m.   Generalized: Well developed, in no acute distress  Cardiology: normal rate and rhythm, no murmur auscultated  Respiratory: clear to auscultation bilaterally    Neurological examination  Mentation: Alert oriented to time, place, history taking. Follows all commands speech and language fluent Cranial nerve II-XII: Pupils were equal round reactive to light. Extraocular movements were full, visual field were full on confrontational test. Facial sensation and strength were  normal.  Head turning and shoulder shrug  were normal and symmetric. Motor: The motor testing reveals 5 over 5 strength of all 4 extremities. Good symmetric motor tone is noted throughout.  Sensory: Sensory testing is intact to soft touch, temperature and vibration on all 4 extremities. No evidence of extinction is noted.  Coordination: Cerebellar testing reveals good finger-nose-finger and heel-to-shin bilaterally.  Gait and station: Gait is normal.  Reflexes: Deep tendon reflexes are symmetric and normal bilaterally.     DIAGNOSTIC DATA (LABS, IMAGING, TESTING) - I reviewed patient records, labs, notes, testing and imaging myself where available.  Lab Results  Component Value Date   WBC 4.2 09/24/2019   HGB 14.2 09/24/2019   HCT 41.6 09/24/2019   MCV 93 09/24/2019   PLT 203 09/24/2019      Component Value Date/Time   NA 141 06/12/2019 1134   K 4.4 06/12/2019 1134   CL 103 06/12/2019 1134   CO2 24 06/12/2019 1134   GLUCOSE 86 06/12/2019 1134   GLUCOSE 100 (H) 06/02/2019 1144   BUN 9 06/12/2019 1134   CREATININE 0.90 06/12/2019 1134   CALCIUM 9.1 06/12/2019 1134   PROT 6.5 09/24/2019 0916   ALBUMIN 3.9 (L) 09/24/2019 0916   AST 17 09/24/2019 0916   ALT 31 09/24/2019 0916   ALKPHOS 86 09/24/2019 0916   BILITOT 0.3 09/24/2019 0916   GFRNONAA 111 06/12/2019 1134   GFRAA 128 06/12/2019 1134   Lab Results  Component Value Date   CHOL 196 06/12/2019   HDL 60 06/12/2019   LDLCALC 122 (H) 06/12/2019   TRIG 78 06/12/2019   CHOLHDL 3.3 06/12/2019   Lab Results  Component Value Date   HGBA1C 4.9 06/02/2019   Lab Results  Component Value Date   VITAMINB12 661 06/12/2019   Lab Results  Component Value Date   TSH 2.050 06/12/2019    No flowsheet data found.   No flowsheet data found.   ASSESSMENT AND PLAN  35 y.o. year old male  has no past medical history on file. here with   Multiple sclerosis (HCC) - Plan: CBC with Differential/Platelets, MR BRAIN W WO  CONTRAST, MR CERVICAL SPINE W WO CONTRAST, MR THORACIC SPINE W WO CONTRAST, CMP, CANCELED: Hepatic Function Panel  High risk medication use - Plan: CMP  Migraine without aura and without status migrainosus, not intractable   Beatriz is doing very well. He is tolerating Zeposia and denies new or exacerbating symptoms. We will update labs. I will order MRI of brain, cervical and thoracic spine for monitoring. He was encouraged to monitor symptoms closely. Brain fog could be associated with inconsistent sleep patterns due to shift work. He was encouraged to focus on healthy lifestyle habits. He will notify me of any new or worsening symptoms. He has expressed interested in research. I will notify Dr Epimenio Foot. He will follow up with Dr Epimenio Foot in 6 months, sooner if needed. He verbalizes understanding and agreement with this plan.    Orders Placed This Encounter  Procedures  . MR BRAIN W WO CONTRAST    Standing Status:   Future    Standing Expiration Date:   06/30/2021    Order Specific Question:   If indicated for the ordered procedure, I authorize the administration of contrast media per Radiology protocol    Answer:   Yes    Order Specific Question:   What is the patient's sedation requirement?    Answer:   No Sedation    Order Specific Question:   Does the patient have a pacemaker or implanted devices?    Answer:   No    Order Specific Question:   Radiology Contrast Protocol - do NOT remove file path    Answer:   [email or web address]    Order Specific Question:   Preferred imaging location?    Answer:   Internal  . MR CERVICAL SPINE W WO CONTRAST    Standing Status:   Future    Standing Expiration Date:   06/30/2021    Order Specific Question:   If indicated for the ordered procedure, I authorize the administration of contrast media  per Radiology protocol    Answer:   Yes    Order Specific Question:   What is the patient's sedation requirement?    Answer:    No Sedation    Order Specific Question:   Does the patient have a pacemaker or implanted devices?    Answer:   No    Order Specific Question:   Preferred imaging location?    Answer:   Internal  . MR THORACIC SPINE W WO CONTRAST    Standing Status:   Future    Standing Expiration Date:   06/30/2021    Order Specific Question:   GRA to provide read?    Answer:   Yes    Order Specific Question:   If indicated for the ordered procedure, I authorize the administration of contrast media per Radiology protocol    Answer:   Yes    Order Specific Question:   What is the patient's sedation requirement?    Answer:   No Sedation    Order Specific Question:   Does the patient have a pacemaker or implanted devices?    Answer:   No    Order Specific Question:   Preferred imaging location?    Answer:   Internal  . CBC with Differential/Platelets  . CMP     No orders of the defined types were placed in this encounter.     I spent 30 minutes of face-to-face and non-face-to-face time with patient.  This included previsit chart review, lab review, study review, order entry, electronic health record documentation, patient education.    Shawnie Dapper, MSN, FNP-C 06/30/2020, 3:26 PM  [facility] [address] [place], [place] [address] [phone]

## 2020-06-30 NOTE — Patient Instructions (Signed)
Below is our plan:  We will continue Zeposia. I will update labs today. I will also order repeat MRI for monitoring. You soul hear back to schedule in about a week. Call me if you do not hear back.   Please make sure you are staying well hydrated. I recommend 50-60 ounces daily. Well balanced diet and regular exercise encouraged. Consistent sleep schedule with 6-8 hours recommended.   Please continue follow up with care team as directed.   Follow up with Dr Epimenio Foot in 6 months   You may receive a survey regarding today's visit. I encourage you to leave honest feed back as I do use this information to improve patient care. Thank you for seeing me today!     Migraine Headache A migraine headache is a very strong throbbing pain on one side or both sides of your head. This type of headache can also cause other symptoms. It can last from 4 hours to 3 days. Talk with your doctor about what things may bring on (trigger) this condition. What are the causes? The exact cause of this condition is not known. This condition may be triggered or caused by:  Drinking alcohol.  Smoking.  Taking medicines, such as: ? Medicine used to treat chest pain (nitroglycerin). ? Birth control pills. ? Estrogen. ? Some blood pressure medicines.  Eating or drinking certain products.  Doing physical activity. Other things that may trigger a migraine headache include:  Having a menstrual period.  Pregnancy.  Hunger.  Stress.  Not getting enough sleep or getting too much sleep.  Weather changes.  Tiredness (fatigue). What increases the risk?  Being 5-32 years old.  Being male.  Having a family history of migraine headaches.  Being Caucasian.  Having depression or anxiety.  Being very overweight. What are the signs or symptoms?  A throbbing pain. This pain may: ? Happen in any area of the head, such as on one side or both sides. ? Make it hard to do daily activities. ? Get worse  with physical activity. ? Get worse around bright lights or loud noises.  Other symptoms may include: ? Feeling sick to your stomach (nauseous). ? Vomiting. ? Dizziness. ? Being sensitive to bright lights, loud noises, or smells.  Before you get a migraine headache, you may get warning signs (an aura). An aura may include: ? Seeing flashing lights or having blind spots. ? Seeing bright spots, halos, or zigzag lines. ? Having tunnel vision or blurred vision. ? Having numbness or a tingling feeling. ? Having trouble talking. ? Having weak muscles.  Some people have symptoms after a migraine headache (postdromal phase), such as: ? Tiredness. ? Trouble thinking (concentrating). How is this treated?  Taking medicines that: ? Relieve pain. ? Relieve the feeling of being sick to your stomach. ? Prevent migraine headaches.  Treatment may also include: ? Having acupuncture. ? Avoiding foods that bring on migraine headaches. ? Learning ways to control your body functions (biofeedback). ? Therapy to help you know and deal with negative thoughts (cognitive behavioral therapy). Follow these instructions at home: Medicines  Take over-the-counter and prescription medicines only as told by your doctor.  Ask your doctor if the medicine prescribed to you: ? Requires you to avoid driving or using heavy machinery. ? Can cause trouble pooping (constipation). You may need to take these steps to prevent or treat trouble pooping:  Drink enough fluid to keep your pee (urine) pale yellow.  Take over-the-counter or prescription medicines.  Eat foods that are high in fiber. These include beans, whole grains, and fresh fruits and vegetables.  Limit foods that are high in fat and sugar. These include fried or sweet foods. Lifestyle  Do not drink alcohol.  Do not use any products that contain nicotine or tobacco, such as cigarettes, e-cigarettes, and chewing tobacco. If you need help quitting,  ask your doctor.  Get at least 8 hours of sleep every night.  Limit and deal with stress. General instructions  Keep a journal to find out what may bring on your migraine headaches. For example, write down: ? What you eat and drink. ? How much sleep you get. ? Any change in what you eat or drink. ? Any change in your medicines.  If you have a migraine headache: ? Avoid things that make your symptoms worse, such as bright lights. ? It may help to lie down in a dark, quiet room. ? Do not drive or use heavy machinery. ? Ask your doctor what activities are safe for you.  Keep all follow-up visits as told by your doctor. This is important.      Contact a doctor if:  You get a migraine headache that is different or worse than others you have had.  You have more than 15 headache days in one month. Get help right away if:  Your migraine headache gets very bad.  Your migraine headache lasts longer than 72 hours.  You have a fever.  You have a stiff neck.  You have trouble seeing.  Your muscles feel weak or like you cannot control them.  You start to lose your balance a lot.  You start to have trouble walking.  You pass out (faint).  You have a seizure. Summary  A migraine headache is a very strong throbbing pain on one side or both sides of your head. These headaches can also cause other symptoms.  This condition may be treated with medicines and changes to your lifestyle.  Keep a journal to find out what may bring on your migraine headaches.  Contact a doctor if you get a migraine headache that is different or worse than others you have had.  Contact your doctor if you have more than 15 headache days in a month. This information is not intended to replace advice given to you by your health care provider. Make sure you discuss any questions you have with your health care provider. Document Revised: 06/21/2018 Document Reviewed: 04/11/2018 Elsevier Patient Education   2021 Elsevier Inc.   Multiple Sclerosis Multiple sclerosis (MS) is a disease of the brain, spinal cord, and optic nerves (central nervous system). It causes the body's disease-fighting (immune) system to destroy the protective covering (myelin sheath) around nerves in the brain. When this happens, signals (nerve impulses) going to and from the brain and spinal cord do not get sent properly or may not get sent at all. There are several types of MS:  Relapsing-remitting MS. This is the most common type. This causes sudden attacks of symptoms. After an attack, you may recover completely until the next attack, or some symptoms may remain permanently.  Secondary progressive MS. This usually develops after the onset of relapsing-remitting MS. Similar to relapsing-remitting MS, this type also causes sudden attacks of symptoms. Attacks may be less frequent, but symptoms slowly get worse (progress) over time.  Primary progressive MS. This causes symptoms that steadily progress over time. This type of MS does not cause sudden attacks of symptoms. The  age of onset of MS varies, but it often develops between 6720-36 years of age. MS is a lifelong (chronic) condition. There is no cure, but treatment can help slow down the progression of the disease. What are the causes? The cause of this condition is not known. What increases the risk? You are more likely to develop this condition if:  You are a woman.  You have a relative with MS. However, the condition is not passed from parent to child (inherited).  You have a lack (deficiency) of vitamin D.  You smoke. MS is more common in the [place] than in the [place]. What are the signs or symptoms? Relapsing-remitting and secondary progressive MS cause symptoms to occur in episodes or attacks that may last weeks to months. There may be long periods between attacks in which there are almost no symptoms. Primary progressive MS  causes symptoms to steadily progress after they develop. Symptoms of MS vary because of the many different ways it affects the central nervous system. The main symptoms include:  Vision problems and eye pain.  Numbness and weakness.  Inability to move your arms, hands, feet, or legs (paralysis).  Balance problems.  Shaking that you cannot control (tremors).  Muscle spasms.  Problems with thinking (cognitive changes). MS can also cause symptoms that are associated with the disease, but are not always the direct result of an MS attack. They may include:  Inability to control urination or bowel movements (incontinence).  Headaches.  Fatigue.  Inability to tolerate heat.  Emotional changes.  Depression.  Pain. How is this diagnosed? This condition is diagnosed based on:  Your symptoms.  A neurological exam. This involves checking central nervous system function, such as nerve function, reflexes, and coordination.  MRIs of the brain and spinal cord.  Lab tests, including a lumbar puncture that tests the fluid that surrounds the brain and spinal cord (cerebrospinal fluid).  Tests to measure the electrical activity of the brain in response to stimulation (evoked potentials). How is this treated? There is no cure for MS, but medicines can help decrease the number and frequency of attacks and help relieve nuisance symptoms. Treatment options may include:  Medicines that reduce the frequency of attacks. These medicines may be given by injection, by mouth (orally), or through an IV.  Medicines that reduce inflammation (steroids). These may provide short-term relief of symptoms.  Medicines to help control pain, depression, fatigue, or incontinence.  Nutritional counseling. Vitamin D supplements, if you have a deficiency.  Using devices to help you move around (assistive devices), such as braces, a cane, or a walker.  Physical therapy to strengthen and stretch your  muscles.  Occupational therapy to help you with everyday tasks.  Alternative or complementary treatments such as exercise, massage, or acupuncture.   Follow these instructions at home:  Take over-the-counter and prescription medicines only as told by your health care provider.  Do not drive or use heavy machinery while taking prescription pain medicine.  Use assistive devices as recommended by your physical therapist or your health care provider.  Exercise as directed by your health care provider.  Eating healthy can help manage MS symptoms.  Return to your normal activities as told by your health care provider. Ask your health care provider what activities are safe for you.  Reach out for support. Share your feelings with friends, family, or a support group.  Keep all follow-up visits as told by your health care provider and therapists.  This is important. Where to find more information  National Multiple Sclerosis Society: https://www.nationalmssociety.org  [employer] of Neurological Disorders and Stroke: https://johnson-smith.net/  [employer] for Complementary and Integrative Health: http://miller-hamilton.net/ Contact a health care provider if:  You feel depressed.  You develop new pain or numbness.  You have tremors.  You have problems with sexual function. Get help right away if:  You develop paralysis.  You develop numbness.  You have problems with your bladder or bowel function.  You develop double vision.  You lose vision in one or both eyes.  You develop suicidal thoughts.  You develop severe confusion. If you ever feel like you may hurt yourself or others, or have thoughts about taking your own life, get help right away. You can go to your nearest emergency department or call:  Your local emergency services (911 in the [place]  A suicide crisis helpline, such as the National Suicide Prevention Lifeline at 6517959965. This is open 24  hours a day. Summary  Multiple sclerosis (MS) is a disease of the central nervous system that causes the body's immune system to destroy the protective covering (myelin sheath) around nerves in the brain.  There are 3 types of MS: relapsing-remitting, secondary progressive, and primary progressive. Relapsing-remitting and secondary progressive MS cause symptoms to occur in episodes or attacks that may last weeks to months. Primary progressive MS causes symptoms to steadily progress after they develop.  There is no cure for MS, but medicines can help decrease the number and frequency of attacks and help relieve nuisance symptoms. Treatment may also include physical or occupational therapy.  If you develop numbness, paralysis, vision problems, or other neurological symptoms, get help right away. This information is not intended to replace advice given to you by your health care provider. Make sure you discuss any questions you have with your health care provider. Document Revised: 12/09/2019 Document Reviewed: 12/09/2019 Elsevier Patient Education  2021 [employer].

## 2020-07-01 LAB — CBC WITH DIFFERENTIAL/PLATELET
Basophils Absolute: 0 10*3/uL (ref 0.0–0.2)
Basos: 1 %
EOS (ABSOLUTE): 0.1 10*3/uL (ref 0.0–0.4)
Eos: 3 %
Hematocrit: 44.9 % (ref 37.5–51.0)
Hemoglobin: 14.9 g/dL (ref 13.0–17.7)
Immature Grans (Abs): 0 10*3/uL (ref 0.0–0.1)
Immature Granulocytes: 1 %
Lymphocytes Absolute: 0.4 10*3/uL — ABNORMAL LOW (ref 0.7–3.1)
Lymphs: 10 %
MCH: 30.9 pg (ref 26.6–33.0)
MCHC: 33.2 g/dL (ref 31.5–35.7)
MCV: 93 fL (ref 79–97)
Monocytes Absolute: 0.5 10*3/uL (ref 0.1–0.9)
Monocytes: 15 %
Neutrophils Absolute: 2.6 10*3/uL (ref 1.4–7.0)
Neutrophils: 70 %
Platelets: 226 10*3/uL (ref 150–450)
RBC: 4.82 x10E6/uL (ref 4.14–5.80)
RDW: 12.1 % (ref 11.6–15.4)
WBC: 3.6 10*3/uL (ref 3.4–10.8)

## 2020-07-01 LAB — COMPREHENSIVE METABOLIC PANEL
ALT: 40 IU/L (ref 0–44)
AST: 23 IU/L (ref 0–40)
Albumin/Globulin Ratio: 1.7 (ref 1.2–2.2)
Albumin: 4.2 g/dL (ref 4.0–5.0)
Alkaline Phosphatase: 69 IU/L (ref 44–121)
BUN/Creatinine Ratio: 12 (ref 9–20)
BUN: 12 mg/dL (ref 6–20)
Bilirubin Total: 0.5 mg/dL (ref 0.0–1.2)
CO2: 25 mmol/L (ref 20–29)
Calcium: 9.3 mg/dL (ref 8.7–10.2)
Chloride: 104 mmol/L (ref 96–106)
Creatinine, Ser: 1.03 mg/dL (ref 0.76–1.27)
Globulin, Total: 2.5 g/dL (ref 1.5–4.5)
Glucose: 89 mg/dL (ref 65–99)
Potassium: 4.3 mmol/L (ref 3.5–5.2)
Sodium: 142 mmol/L (ref 134–144)
Total Protein: 6.7 g/dL (ref 6.0–8.5)
eGFR: 97 mL/min/{1.73_m2} (ref >59)

## 2020-07-01 NOTE — Progress Notes (Signed)
I have read the note, and I agree with the clinical assessment and plan.  Kel Senn A. Adeyemi Hamad, MD, PhD, FAAN Certified in Neurology, Clinical Neurophysiology, Sleep Medicine, Pain Medicine and Neuroimaging  [facility] [address], [address] [place], [place] [address] [phone]  

## 2020-07-05 ENCOUNTER — Telehealth: Payer: Self-pay | Admitting: Family Medicine

## 2020-07-06 NOTE — Telephone Encounter (Signed)
LVM for pt to call back about scheduling mri  [employer] auth: [id number] (exp. 07/05/20 to 08/03/20)

## 2020-07-15 ENCOUNTER — Other Ambulatory Visit: Payer: Self-pay | Admitting: Neurology

## 2020-07-16 ENCOUNTER — Telehealth: Payer: Self-pay | Admitting: Family Medicine

## 2020-07-16 NOTE — Telephone Encounter (Signed)
[facility]) called, prescription has expired for Ozanimod HCl (ZEPOSIA) 0.92 MG CAPS. Need a new prescription, scheduled for delivery 07/21/20. Would like a call from the nurse.

## 2020-07-19 ENCOUNTER — Encounter: Payer: Self-pay | Admitting: Family Medicine

## 2020-07-19 NOTE — Telephone Encounter (Signed)
Prescription was sent electronically today.

## 2020-07-20 ENCOUNTER — Telehealth: Payer: Self-pay | Admitting: *Deleted

## 2020-07-20 NOTE — Telephone Encounter (Signed)
Faxed completed PA Zeposia to [facility] at [phone]. Received fax confirmation. Marked urgent. Waiting on determination.

## 2020-07-20 NOTE — Telephone Encounter (Signed)
Received [facility] approval for Zeposia 07-20-20 thru 07-20-2021. PA# post holdings [phone] [facility].

## 2020-07-22 NOTE — Telephone Encounter (Signed)
Updated [employer] auth.  no to the covid questions MR Brain w/wo contrast , MR Cervical spine w/wo contrast & MR Thoracic spine w/wo contrast Amy Evalee Jefferson Berkley Harvey: [phone] (exp. 07/22/20 to 08/20/20)   Patient is scheduled at [facility] for 08/04/20.

## 2020-08-02 ENCOUNTER — Encounter: Payer: Self-pay | Admitting: Family Medicine

## 2020-08-02 ENCOUNTER — Encounter: Payer: Self-pay | Admitting: *Deleted

## 2020-08-04 ENCOUNTER — Ambulatory Visit: Payer: BC Managed Care – PPO

## 2020-08-04 DIAGNOSIS — G35 Multiple sclerosis: Secondary | ICD-10-CM

## 2020-08-04 MED ORDER — GADOBENATE DIMEGLUMINE 529 MG/ML IV SOLN
20.0000 mL | Freq: Once | INTRAVENOUS | Status: AC | PRN
  Administered 2020-08-04: 20 mL via INTRAVENOUS

## 2020-12-28 ENCOUNTER — Ambulatory Visit (INDEPENDENT_AMBULATORY_CARE_PROVIDER_SITE_OTHER): Payer: BC Managed Care – PPO | Admitting: Neurology

## 2020-12-28 ENCOUNTER — Encounter: Payer: Self-pay | Admitting: Nurse Practitioner

## 2020-12-28 ENCOUNTER — Other Ambulatory Visit: Payer: Self-pay

## 2020-12-28 ENCOUNTER — Encounter: Payer: Self-pay | Admitting: Neurology

## 2020-12-28 ENCOUNTER — Ambulatory Visit (INDEPENDENT_AMBULATORY_CARE_PROVIDER_SITE_OTHER): Payer: BC Managed Care – PPO | Admitting: Nurse Practitioner

## 2020-12-28 VITALS — BP 122/88 | HR 78 | Temp 98.0°F | Ht 74.0 in | Wt 272.8 lb

## 2020-12-28 VITALS — BP 132/74 | HR 78 | Ht 74.0 in | Wt 274.5 lb

## 2020-12-28 DIAGNOSIS — Z6835 Body mass index (BMI) 35.0-35.9, adult: Secondary | ICD-10-CM

## 2020-12-28 DIAGNOSIS — E6609 Other obesity due to excess calories: Secondary | ICD-10-CM

## 2020-12-28 DIAGNOSIS — Z79899 Other long term (current) drug therapy: Secondary | ICD-10-CM | POA: Diagnosis not present

## 2020-12-28 DIAGNOSIS — G35 Multiple sclerosis: Secondary | ICD-10-CM | POA: Diagnosis not present

## 2020-12-28 DIAGNOSIS — E559 Vitamin D deficiency, unspecified: Secondary | ICD-10-CM

## 2020-12-28 DIAGNOSIS — G43009 Migraine without aura, not intractable, without status migrainosus: Secondary | ICD-10-CM | POA: Diagnosis not present

## 2020-12-28 DIAGNOSIS — Z7689 Persons encountering health services in other specified circumstances: Secondary | ICD-10-CM | POA: Diagnosis not present

## 2020-12-28 DIAGNOSIS — R202 Paresthesia of skin: Secondary | ICD-10-CM

## 2020-12-28 NOTE — Progress Notes (Signed)
I, Turkey Hamilton,acting as a Neurosurgeon for [employer], FNP.,have documented all relevant documentation on the behalf of Arnette Felts, FNP,as directed by  Arnette Felts, FNP while in the presence of Arnette Felts, FNP.   This visit occurred during the SARS-CoV-2 public health emergency.  Safety protocols were in place, including screening questions prior to the visit, additional usage of staff PPE, and extensive cleaning of exam room while observing appropriate contact time as indicated for disinfecting solutions.  Subjective:     Patient ID: Jacob Wong , male    DOB: 04-27-1985 , 35 y.o.   MRN: [id number]   Chief Complaint  Patient presents with   Establish Care    HPI  Pt presents today to establish care. His previous PCP, [address] left the facility before closing. Currently works in a Set designer.  Single (engaged). He has 4 children - 3 boys (7 y/o, 37, 1 y/o) and 1 girl (twin - 7) - all healthy.  Pt does not have any specific concerns, would like to further discuss weight loss options.   PMH - Diagnosed with MS in 2021 - sudden onset of cramping in his toes on left side for 3 days straight. He is being followed by Dr. Epimenio Foot and is being treated with Zeposia. He has had an improvement in his symptoms. Followed two times a year.    Wt Readings from Last 3 Encounters: 12/28/20 : 272 lb 12.8 oz (123.7 kg) 06/30/20 : 272 lb (123.4 kg) 09/24/19 : 282 lb (127.9 kg)  He has always been a heavier child. Exercising on and off. He works night shift 5p-5a.  Currently feels his diet is decent - breakfast and dinner, in the morning will eat pancakes eggs and bacon.  He is keeping his one year old at home during the day. Yearly noticing his weight is going up. He drinks water or gatorade. Admits he could cut out sweets and baked sweets.  Maternal grandmother passed from Pancreatic cancer at age 43.     History reviewed. No pertinent past medical history.   Family History  Problem  Relation Age of Onset   Hypertension Mother    Hypertension Father      Current Outpatient Medications:    ZEPOSIA 0.92 MG CAPS, MAINTENANCE DOSE: TAKE 1 CAPSULE BY MOUTH 1 TIME A DAY., Disp: 90 capsule, Rfl: 3   Allergies  Allergen Reactions   Amoxicillin Rash    Did it involve swelling of the face/tongue/throat, SOB, or low BP? No Did it involve sudden or severe rash/hives, skin peeling, or any reaction on the inside of your mouth or nose? Yes Did you need to seek medical attention at a hospital or doctor's office? Yes When did it last happen?      Pt was a child If all above answers are "NO", may proceed with cephalosporin use.      Review of Systems  Constitutional: Negative.   HENT: Negative.    Eyes: Negative.   Respiratory: Negative.    Cardiovascular: Negative.   Gastrointestinal: Negative.   Endocrine: Positive for polyuria. Negative for polydipsia and polyphagia.  Skin: Negative.   Allergic/Immunologic: Negative.   Neurological: Negative.   Hematological: Negative.   Psychiatric/Behavioral: Negative.      Today's Vitals   12/28/20 1103  BP: 122/88  Pulse: 78  Temp: 98 F (36.7 C)  Weight: 272 lb 12.8 oz (123.7 kg)  Height: 6\' 2"  (1.88 m)  PainSc: 0-No pain   Body mass index is 35.03  kg/m.  Wt Readings from Last 3 Encounters:  12/28/20 274 lb 8 oz (124.5 kg)  12/28/20 272 lb 12.8 oz (123.7 kg)  06/30/20 272 lb (123.4 kg)    Objective:  Physical Exam Vitals reviewed.  Constitutional:      General: He is not in acute distress.    Appearance: Normal appearance. He is obese.  Cardiovascular:     Rate and Rhythm: Normal rate and regular rhythm.     Pulses: Normal pulses.     Heart sounds: Normal heart sounds. No murmur heard. Pulmonary:     Effort: Pulmonary effort is normal. No respiratory distress.     Breath sounds: Normal breath sounds. No wheezing.  Skin:    Capillary Refill: Capillary refill takes less than 2 seconds.  Neurological:      General: No focal deficit present.     Mental Status: He is alert and oriented to person, place, and time.     Cranial Nerves: No cranial nerve deficit.     Motor: No weakness.  Psychiatric:        Mood and Affect: Mood normal.        Behavior: Behavior normal.        Thought Content: Thought content normal.        Judgment: Judgment normal.        Assessment And Plan:     1. Class 2 obesity due to excess calories in adult, unspecified BMI, unspecified whether serious comorbidity present Comments: Will check for metabolic causes, encouraged to increase physical activity and eat healthy diet low in sugar and carbs.  He is encouraged to strive for BMI less than 30 to decrease cardiac risk. Advised to aim for at least 150 minutes of exercise per week.  - Hemoglobin A1c - TSH - Insulin, random  2. Multiple sclerosis (HCC) Comments: Stable per patient and denies any recent exacerbations  3. Encounter to establish care    Patient was given opportunity to ask questions. Patient verbalized understanding of the plan and was able to repeat key elements of the plan. All questions were answered to their satisfaction.  Arnette Felts, FNP   I, Arnette Felts, FNP, have reviewed all documentation for this visit. The documentation on 01/17/21 for the exam, diagnosis, procedures, and orders are all accurate and complete.   IF YOU HAVE BEEN REFERRED TO A SPECIALIST, IT MAY TAKE 1-2 WEEKS TO SCHEDULE/PROCESS THE REFERRAL. IF YOU HAVE NOT HEARD FROM US/SPECIALIST IN TWO WEEKS, PLEASE GIVE [place] A CALL AT [phone].   THE PATIENT IS ENCOURAGED TO PRACTICE SOCIAL DISTANCING DUE TO THE COVID-19 PANDEMIC.

## 2020-12-28 NOTE — Progress Notes (Signed)
[facility]  PATIENT: Jacob Wong DOB: 1985-08-20  REFERRING DOCTOR OR PCP:  Janne Lab, NP SOURCE: Patient, notes from hospital admission, imaging and lab reports, MRI images personally reviewed.  _________________________________   HISTORICAL  CHIEF COMPLAINT:  Chief Complaint  Patient presents with   Follow-up    Rm 1, alone. Here for 6 month MS f/u, on Zeposia. Pt reports being stable. No new or worsening in sx.     HISTORY OF PRESENT ILLNESS: Jacob Wong a 35 y.o. man with relapsing remitting  MS.  Update 12/28/2020: He is on Zeposia and doing well.  He denies any exacerbations.  He has no trouble with tolerability.   He denies any MS exacerbations or any significant new neurologic symptoms.  He is walking well and has no trouble with stairs.   Strength and sensation are normal in his limbs.    He has no more episodes of phasic spasms in the left arm and denies any spasticity.  No ataxia.     Bladder function is fine.   Vision is doing well.  He notes some fatigue at times.  However, he does work third shift as a Location manager.  On his days off he tries to align his schedule with others more.   He is sleeping well now.    He denies any issues with mood or cognition.    .   He has fever migraine headaches then last year.  When 1 occurs, he has nausea, phottphobia and phonophobia.    Vitamin D with low at diagnosis (20.5) he took 50,000 units weekly for 6 months and is now taking OTC supplements  MS History (obtained 06/25/2019): He presented to the [facility] emergency room on 06/02/2019 with several days of left sided tingling and cramping.   His hand would cramp into a fist.    He had phasic spasming in the hand for about 15 - 20 seconds, often after standing up.     In the hospital, he had MRI of the brain and cervical spine and an LP.   He received 5 days of IV Solu-Medrol.    He did not improve until a few days after discharge and now feels  close to baseline.   In retrospect, he has not had other episodes of neurologic dysfunction.     Data reviewed: MRI of the brain 06/02/2019 showed multiple T2/FLAIR hyperintense foci in the hemispheres and right lateral thalamus.  The thalamic/IC focus and 3 other foci enhanced after contrast.  The MRI of the cervical spine showed a normal spinal cord.  There was mild to moderate degenerative changes at C3-C4.     MRI of the thoracic spine 06/04/2019 showed 1 focus anteromedially adjacent to T7  MRI of the brain, cervical spine and thoracic spine 08/04/2020 was reviewed.  MRI of the brain showed foci in the hemispheres and thalamus consistent with MS.  None of them enhance.  There were no new lesions compared to 2021.  The foci that enhanced in 2021 no longer did so.  MRI of the cervical spine shows a tiny spot to the right at C3.  That spot was actually present on the earlier MRI as well.Marland Kitchen  MRI of the thoracic spine was normal.  Labs:  While in the hospital he had a lumbar puncture performed.  There were 10 oligoclonal bands and the IgG index was markedly elevated to 2.3.  Vitamin D was mildly low.  HIV and the neuromyelitis optica antibody  was negative.  He has no FH of MS but does not know all his relatives.      REVIEW OF SYSTEMS: Constitutional: No fevers, chills, sweats, or change in appetite Eyes: No visual changes, double vision, eye pain Ear, nose and throat: No hearing loss, ear pain, nasal congestion, sore throat Cardiovascular: No chest pain, palpitations Respiratory:  No shortness of breath at rest or with exertion.   No wheezes GastrointestinaI: No nausea, vomiting, diarrhea, abdominal pain, fecal incontinence Genitourinary:  No dysuria, urinary retention or frequency.  No nocturia. Musculoskeletal:  No neck pain, back pain Integumentary: No rash, pruritus, skin lesions Neurological: as above Psychiatric: No depression at this time.  No anxiety Endocrine: No palpitations,  diaphoresis, change in appetite, change in weigh or increased thirst Hematologic/Lymphatic:  No anemia, purpura, petechiae. Allergic/Immunologic: No itchy/runny eyes, nasal congestion, recent allergic reactions, rashes  ALLERGIES: Allergies  Allergen Reactions   Amoxicillin Rash    Did it involve swelling of the face/tongue/throat, SOB, or low BP? No Did it involve sudden or severe rash/hives, skin peeling, or any reaction on the inside of your mouth or nose? Yes Did you need to seek medical attention at a hospital or doctor's office? Yes When did it last happen?      Pt was a child If all above answers are "NO", may proceed with cephalosporin use.     HOME MEDICATIONS:  Current Outpatient Medications:    ZEPOSIA 0.92 MG CAPS, MAINTENANCE DOSE: TAKE 1 CAPSULE BY MOUTH 1 TIME A DAY., Disp: 90 capsule, Rfl: 3  PAST MEDICAL HISTORY: History reviewed. No pertinent past medical history.  PAST SURGICAL HISTORY: History reviewed. No pertinent surgical history.  FAMILY HISTORY: Family History  Problem Relation Age of Onset   Hypertension Mother    Hypertension Father     SOCIAL HISTORY:  Social History   Socioeconomic History   Marital status: Significant Other    Spouse name: Fiance: Regulatory affairs officer   Number of children: 4   Years of education: Some college   Highest education level: Not on file  Occupational History   Occupation: [employer]  Tobacco Use   Smoking status: Never   Smokeless tobacco: Never  Substance and Sexual Activity   Alcohol use: Yes    Comment: socially   Drug use: Never   Sexual activity: Yes  Other Topics Concern   Not on file  Social History Narrative   Coffee/energy drinks sometimes   Lives with fiance and 4 children   Right handed    Social Determinants of Health   Financial Resource Strain: Not on file  Food Insecurity: Not on file  Transportation Needs: Not on file  Physical Activity: Not on file  Stress: Not on file   Social Connections: Not on file  Intimate Partner Violence: Not on file     PHYSICAL EXAM  Vitals:   12/28/20 1609  BP: 132/74  Pulse: 78  Weight: 274 lb 8 oz (124.5 kg)  Height: 6\' 2"  (1.88 m)    Body mass index is 35.24 kg/m.   General: The patient is well-developed and well-nourished and in no acute distress  HEENT:  Head is [place]/AT.  Sclera are anicteric.   Skin: Extremities are without rash or  edema.  Neurologic Exam  Mental status: The patient is alert and oriented x 3 at the time of the examination. The patient has apparent normal recent and remote memory, with an apparently normal attention span and concentration ability.  Speech is normal.  Cranial nerves: Extraocular movements are full . Color vision was symmetric.  Facial symmetry is present.  Facial strength is normal.  Trapezius and sternocleidomastoid strength is normal. No dysarthria is noted.   No obvious hearing deficits are noted.  Motor:  Muscle bulk is normal.   Tone is normal. Strength is  5 / 5 in all 4 extremities.   Sensory: Sensory testing is intact to pinprick, soft touch and vibration sensation in all 4 extremities.  Coordination: Cerebellar testing reveals good finger-nose-finger and heel-to-shin bilaterally.  Gait and station: Station is normal.   Gait and tandem gait are normal.  Romberg is negative.   Reflexes: Deep tendon reflexes are symmetric and normal bilaterally.   Plantar responses are flexor.    DIAGNOSTIC DATA (LABS, IMAGING, TESTING) - I reviewed patient records, labs, notes, testing and imaging myself where available.  Lab Results  Component Value Date   WBC 3.6 06/30/2020   HGB 14.9 06/30/2020   HCT 44.9 06/30/2020   MCV 93 06/30/2020   PLT 226 06/30/2020      Component Value Date/Time   NA 142 06/30/2020 1540   K 4.3 06/30/2020 1540   CL 104 06/30/2020 1540   CO2 25 06/30/2020 1540   GLUCOSE 89 06/30/2020 1540   GLUCOSE 100 (H) 06/02/2019 1144   BUN 12  06/30/2020 1540   CREATININE 1.03 06/30/2020 1540   CALCIUM 9.3 06/30/2020 1540   PROT 6.7 06/30/2020 1540   ALBUMIN 4.2 06/30/2020 1540   AST 23 06/30/2020 1540   ALT 40 06/30/2020 1540   ALKPHOS 69 06/30/2020 1540   BILITOT 0.5 06/30/2020 1540   GFRNONAA 111 06/12/2019 1134   GFRAA 128 06/12/2019 1134   Lab Results  Component Value Date   CHOL 196 06/12/2019   HDL 60 06/12/2019   LDLCALC 122 (H) 06/12/2019   TRIG 78 06/12/2019   CHOLHDL 3.3 06/12/2019   Lab Results  Component Value Date   HGBA1C 4.9 06/02/2019   Lab Results  Component Value Date   VITAMINB12 661 06/12/2019   Lab Results  Component Value Date   TSH 2.050 06/12/2019       ASSESSMENT AND PLAN  Multiple sclerosis (HCC) - Plan: CBC with Differential/Platelet, Hepatic function panel  High risk medication use - Plan: CBC with Differential/Platelet, Hepatic function panel  Arm paresthesia, left  Migraine without aura and without status migrainosus, not intractable  Vitamin D deficiency - Plan: VITAMIN D 25 Hydroxy (Vit-D Deficiency, Fractures)   1.   Continue Zeposia.  I will check some lab work today..  We can check annual MRI of the brain to determine if there is any subclinical progression.   2.   Stay active and exercise. 3.   Return in 6 months or sooner if there are new or worsening neurologic symptoms.  Mineola Duan A. Epimenio Foot, MD, [facility] 12/28/2020, 5:38 PM Certified in Neurology, Clinical Neurophysiology, Sleep Medicine and Neuroimaging  [facility] [address], [address] [place], [place] [address] [phone]

## 2020-12-29 LAB — HEPATIC FUNCTION PANEL
ALT: 48 IU/L — ABNORMAL HIGH (ref 0–44)
AST: 28 IU/L (ref 0–40)
Albumin: 4.3 g/dL (ref 4.0–5.0)
Alkaline Phosphatase: 73 IU/L (ref 44–121)
Bilirubin Total: 0.4 mg/dL (ref 0.0–1.2)
Bilirubin, Direct: 0.12 mg/dL (ref 0.00–0.40)
Total Protein: 6.6 g/dL (ref 6.0–8.5)

## 2020-12-29 LAB — CBC WITH DIFFERENTIAL/PLATELET
Basophils Absolute: 0 10*3/uL (ref 0.0–0.2)
Basos: 1 %
EOS (ABSOLUTE): 0.1 10*3/uL (ref 0.0–0.4)
Eos: 3 %
Hematocrit: 42.5 % (ref 37.5–51.0)
Hemoglobin: 14 g/dL (ref 13.0–17.7)
Immature Grans (Abs): 0 10*3/uL (ref 0.0–0.1)
Immature Granulocytes: 0 %
Lymphocytes Absolute: 0.4 10*3/uL — ABNORMAL LOW (ref 0.7–3.1)
Lymphs: 11 %
MCH: 30.8 pg (ref 26.6–33.0)
MCHC: 32.9 g/dL (ref 31.5–35.7)
MCV: 94 fL (ref 79–97)
Monocytes Absolute: 0.5 10*3/uL (ref 0.1–0.9)
Monocytes: 13 %
Neutrophils Absolute: 2.8 10*3/uL (ref 1.4–7.0)
Neutrophils: 72 %
Platelets: 223 10*3/uL (ref 150–450)
RBC: 4.54 x10E6/uL (ref 4.14–5.80)
RDW: 12.1 % (ref 11.6–15.4)
WBC: 3.9 10*3/uL (ref 3.4–10.8)

## 2020-12-29 LAB — HEMOGLOBIN A1C
Est. average glucose Bld gHb Est-mCnc: 100 mg/dL
Hgb A1c MFr Bld: 5.1 % (ref 4.8–5.6)

## 2020-12-29 LAB — INSULIN, RANDOM: INSULIN: 18 u[IU]/mL (ref 2.6–24.9)

## 2020-12-29 LAB — VITAMIN D 25 HYDROXY (VIT D DEFICIENCY, FRACTURES): Vit D, 25-Hydroxy: 26.2 ng/mL — ABNORMAL LOW (ref 30.0–100.0)

## 2020-12-29 LAB — TSH: TSH: 1.13 u[IU]/mL (ref 0.450–4.500)

## 2021-01-17 ENCOUNTER — Encounter: Payer: Self-pay | Admitting: Nurse Practitioner

## 2021-01-20 ENCOUNTER — Telehealth: Payer: Self-pay | Admitting: Neurology

## 2021-01-20 NOTE — Telephone Encounter (Signed)
Received a notification from the [employer] patient support program.  Patient's shipment of Zeposia was sent around 01/17/21.

## 2021-05-03 ENCOUNTER — Encounter: Payer: BC Managed Care – PPO | Admitting: Nurse Practitioner

## 2021-06-22 ENCOUNTER — Ambulatory Visit (INDEPENDENT_AMBULATORY_CARE_PROVIDER_SITE_OTHER): Payer: BC Managed Care – PPO | Admitting: Neurology

## 2021-06-22 ENCOUNTER — Encounter: Payer: Self-pay | Admitting: Neurology

## 2021-06-22 VITALS — BP 137/90 | HR 84 | Ht 74.0 in | Wt 268.0 lb

## 2021-06-22 DIAGNOSIS — G43009 Migraine without aura, not intractable, without status migrainosus: Secondary | ICD-10-CM

## 2021-06-22 DIAGNOSIS — Z79899 Other long term (current) drug therapy: Secondary | ICD-10-CM

## 2021-06-22 DIAGNOSIS — G35 Multiple sclerosis: Secondary | ICD-10-CM | POA: Diagnosis not present

## 2021-06-22 DIAGNOSIS — R202 Paresthesia of skin: Secondary | ICD-10-CM

## 2021-06-22 DIAGNOSIS — E559 Vitamin D deficiency, unspecified: Secondary | ICD-10-CM | POA: Diagnosis not present

## 2021-06-22 NOTE — Progress Notes (Addendum)
? ?[facility] ? ?PATIENT: Jacob Wong ?DOB: 10/22/1985 ? ?REFERRING DOCTOR OR PCP:  Janne Lab, NP ?SOURCE: Patient, notes from hospital admission, imaging and lab reports, MRI images personally reviewed. ? ?_________________________________ ? ? ?HISTORICAL ? ?CHIEF COMPLAINT:  ?Chief Complaint  ?Patient presents with  ? Follow-up  ?  Rm 1, alone. Here for 6 month f/u, on Zeposia and tolerating well. MS stable. No concerns today.   ? ? ?HISTORY OF PRESENT ILLNESS: Jacob Pressleyis a 36 y.o. man with relapsing remitting  MS. ? ?Update 06/22/2021: ?He is on Zeposia and doing well.He tolerates it well.    He denies any exacerbations.  He has no trouble with tolerability.   He denies any MS exacerbations or any significant new neurologic symptoms. ? ?He is walking well and has no trouble with stairs.   Strength and sensation are normal in his limbs.  The left arm numbness resolved.   He has no more episodes of phasic spasms in the left arm and denies any spasticity.  No ataxia.     Bladder function is fine.   Vision is doing well. ? ?He notes some fatigue at times.  However, he does work third shift as a Location manager.  On his days off he tries to align his schedule with others more.   He is sleeping well now.    He denies any issues with mood or cognition.    ?He has 4 kids age 41, 21 (twins) and 14 ?Marland Kitchen   ?He has only rare migraine headaches .  When 1 occurs, he has nausea, phottphobia and phonophobia.    He takes a Goody's powder (no more than a couple times a month ? ?Vitamin D with low at diagnosis (20.5) he took 50,000 units weekly for 6 months and is now taking OTC supplements ? ? ?MS History (obtained 06/25/2019): ?He presented to the [facility] emergency room on 06/02/2019 with several days of left sided tingling and cramping.   His hand would cramp into a fist.    He had phasic spasming in the hand for about 15 - 20 seconds, often after standing up.     In the hospital, he had MRI of the brain  and cervical spine and an LP.   He received 5 days of IV Solu-Medrol.    He did not improve until a few days after discharge and now feels close to baseline.   In retrospect, he has not had other episodes of neurologic dysfunction.   He was started on Zeposia in April 2021. ? ?Imaging:  ?MRI of the brain 06/02/2019 showed multiple T2/FLAIR hyperintense foci in the hemispheres and right lateral thalamus.  The thalamic/IC focus and 3 other foci enhanced after contrast.  The MRI of the cervical spine showed a normal spinal cord.  There was mild to moderate degenerative changes at C3-C4.    ? ?MRI of the thoracic spine 06/04/2019 showed 1 focus anteromedially adjacent to T7 ? ?MRI of the brain, cervical spine and thoracic spine 08/04/2020 was reviewed.  MRI of the brain showed foci in the hemispheres and thalamus consistent with MS.  None of them enhance.  There were no new lesions compared to 2021.  The foci that enhanced in 2021 no longer did so.  MRI of the cervical spine shows a tiny spot to the right at C3.  That spot was actually present on the earlier MRI as well.Marland Kitchen  MRI of the thoracic spine was normal (a previous MRI  had shown a single focus). ? ?Labs:  While in the hospital he had a lumbar puncture performed.  There were 10 oligoclonal bands and the IgG index was markedly elevated to 2.3.  Vitamin D was mildly low.  HIV and the neuromyelitis optica antibody was negative. ? ?He has no FH of MS but does not know all his relatives.    ? ? ?REVIEW OF SYSTEMS: ?Constitutional: No fevers, chills, sweats, or change in appetite ?Eyes: No visual changes, double vision, eye pain ?Ear, nose and throat: No hearing loss, ear pain, nasal congestion, sore throat ?Cardiovascular: No chest pain, palpitations ?Respiratory:  No shortness of breath at rest or with exertion.   No wheezes ?GastrointestinaI: No nausea, vomiting, diarrhea, abdominal pain, fecal incontinence ?Genitourinary:  No dysuria, urinary retention or frequency.  No  nocturia. ?Musculoskeletal:  No neck pain, back pain ?Integumentary: No rash, pruritus, skin lesions ?Neurological: as above ?Psychiatric: No depression at this time.  No anxiety ?Endocrine: No palpitations, diaphoresis, change in appetite, change in weigh or increased thirst ?Hematologic/Lymphatic:  No anemia, purpura, petechiae. ?Allergic/Immunologic: No itchy/runny eyes, nasal congestion, recent allergic reactions, rashes ? ?ALLERGIES: ?Allergies  ?Allergen Reactions  ? Amoxicillin Rash  ?  Did it involve swelling of the face/tongue/throat, SOB, or low BP? No ?Did it involve sudden or severe rash/hives, skin peeling, or any reaction on the inside of your mouth or nose? Yes ?Did you need to seek medical attention at a hospital or doctor's office? Yes ?When did it last happen?      Pt was a child ?If all above answers are ?NO?, may proceed with cephalosporin use. ?  ? ? ?HOME MEDICATIONS: ? ?Current Outpatient Medications:  ?  ZEPOSIA 0.92 MG CAPS, MAINTENANCE DOSE: TAKE 1 CAPSULE BY MOUTH 1 TIME A DAY., Disp: 90 capsule, Rfl: 3 ? ?PAST MEDICAL HISTORY: ?History reviewed. No pertinent past medical history. ? ?PAST SURGICAL HISTORY: ?History reviewed. No pertinent surgical history. ? ?FAMILY HISTORY: ?Family History  ?Problem Relation Age of Onset  ? Hypertension Mother   ? Hypertension Father   ? ? ?SOCIAL HISTORY: ? ?Social History  ? ?Socioeconomic History  ? Marital status: Significant Other  ?  Spouse name: Fiance: Valarie Cones  ? Number of children: 4  ? Years of education: Some college  ? Highest education level: Not on file  ?Occupational History  ? Occupation: [employer]  ?Tobacco Use  ? Smoking status: Never  ? Smokeless tobacco: Never  ?Substance and Sexual Activity  ? Alcohol use: Yes  ?  Comment: socially  ? Drug use: Never  ? Sexual activity: Yes  ?Other Topics Concern  ? Not on file  ?Social History Narrative  ? Coffee/energy drinks sometimes  ? Lives with fiance and 4 children  ? Right  handed   ? ?Social Determinants of Health  ? ?Financial Resource Strain: Not on file  ?Food Insecurity: Not on file  ?Transportation Needs: Not on file  ?Physical Activity: Not on file  ?Stress: Not on file  ?Social Connections: Not on file  ?Intimate Partner Violence: Not on file  ? ? ? ?PHYSICAL EXAM ? ?Vitals:  ? 06/22/21 0817  ?BP: 137/90  ?Pulse: 84  ?Weight: 268 lb (121.6 kg)  ?Height: 6\' 2"  (1.88 m)  ? ? ?Body mass index is 34.41 kg/m?. ? ? ?General: The patient is well-developed and well-nourished and in no acute distress ? ?HEENT:  Head is /AT.  Sclera are anicteric.  ? ?Skin: Extremities are without  rash or  edema. ? ?Neurologic Exam ? ?Mental status: The patient is alert and oriented x 3 at the time of the examination. The patient has apparent normal recent and remote memory, with an apparently normal attention span and concentration ability.   Speech is normal. ? ?Cranial nerves: Extraocular movements are full . Color vision was symmetric.  Facial symmetry is present.  Facial strength is normal.  Trapezius and sternocleidomastoid strength is normal. No dysarthria is noted.   No obvious hearing deficits are noted. ? ?Motor:  Muscle bulk is normal.   Tone is normal. Strength is  5 / 5 in all 4 extremities.  ? ?Sensory: Sensory testing is intact to pinprick, soft touch and vibration sensation in all 4 extremities. ? ?Coordination: Cerebellar testing reveals good finger-nose-finger and heel-to-shin bilaterally. ? ?Gait and station: Station is normal.   Gait and tandem gait are normal.  Romberg is negative. ? ?Reflexes: Deep tendon reflexes are symmetric and normal bilaterally.   Plantar responses are flexor. ? ? ? ?DIAGNOSTIC DATA (LABS, IMAGING, TESTING) ?- I reviewed patient records, labs, notes, testing and imaging myself where available. ? ?Lab Results  ?Component Value Date  ? WBC 3.9 12/28/2020  ? HGB 14.0 12/28/2020  ? HCT 42.5 12/28/2020  ? MCV 94 12/28/2020  ? PLT 223 12/28/2020  ? ?   ?Component  Value Date/Time  ? NA 142 06/30/2020 1540  ? K 4.3 06/30/2020 1540  ? CL 104 06/30/2020 1540  ? CO2 25 06/30/2020 1540  ? GLUCOSE 89 06/30/2020 1540  ? GLUCOSE 100 (H) 06/02/2019 1144  ? BUN 12 04/2

## 2021-06-23 ENCOUNTER — Telehealth: Payer: Self-pay | Admitting: Neurology

## 2021-06-23 LAB — CBC WITH DIFFERENTIAL/PLATELET
Basophils Absolute: 0 10*3/uL (ref 0.0–0.2)
Basos: 1 %
EOS (ABSOLUTE): 0.1 10*3/uL (ref 0.0–0.4)
Eos: 3 %
Hematocrit: 41.1 % (ref 37.5–51.0)
Hemoglobin: 13.7 g/dL (ref 13.0–17.7)
Immature Grans (Abs): 0 10*3/uL (ref 0.0–0.1)
Immature Granulocytes: 0 %
Lymphocytes Absolute: 0.4 10*3/uL — ABNORMAL LOW (ref 0.7–3.1)
Lymphs: 10 %
MCH: 31.1 pg (ref 26.6–33.0)
MCHC: 33.3 g/dL (ref 31.5–35.7)
MCV: 93 fL (ref 79–97)
Monocytes Absolute: 0.6 10*3/uL (ref 0.1–0.9)
Monocytes: 14 %
Neutrophils Absolute: 2.9 10*3/uL (ref 1.4–7.0)
Neutrophils: 72 %
Platelets: 261 10*3/uL (ref 150–450)
RBC: 4.4 x10E6/uL (ref 4.14–5.80)
RDW: 11.8 % (ref 11.6–15.4)
WBC: 4 10*3/uL (ref 3.4–10.8)

## 2021-06-23 LAB — HEPATIC FUNCTION PANEL
ALT: 45 IU/L — ABNORMAL HIGH (ref 0–44)
AST: 32 IU/L (ref 0–40)
Albumin: 4.3 g/dL (ref 4.0–5.0)
Alkaline Phosphatase: 75 IU/L (ref 44–121)
Bilirubin Total: 0.5 mg/dL (ref 0.0–1.2)
Bilirubin, Direct: 0.13 mg/dL (ref 0.00–0.40)
Total Protein: 6.6 g/dL (ref 6.0–8.5)

## 2021-06-23 NOTE — Telephone Encounter (Signed)
LVM for pt to call back to schedule  ?Yetta Numbers: [id number] (exp. 06/23/21 to 07/22/21)  ?

## 2021-07-07 ENCOUNTER — Telehealth: Payer: Self-pay | Admitting: Neurology

## 2021-07-07 NOTE — Telephone Encounter (Signed)
PA completed Via paper form through [facility] [employer].  ?I have completed and submitted  along with office notes to the fax number on the form. Received confirmation that it went through.  ?Will await determination ?

## 2021-07-13 ENCOUNTER — Other Ambulatory Visit: Payer: Self-pay

## 2021-07-13 MED ORDER — ZEPOSIA 0.92 MG PO CAPS: ORAL_CAPSULE | ORAL | 3 refills | Status: DC

## 2021-07-25 ENCOUNTER — Encounter: Payer: Self-pay | Admitting: Family Medicine

## 2021-07-25 ENCOUNTER — Encounter: Payer: Self-pay | Admitting: *Deleted

## 2021-10-11 ENCOUNTER — Telehealth: Payer: Self-pay | Admitting: Family Medicine

## 2021-10-11 NOTE — Telephone Encounter (Signed)
Left mychart msg to inform pt of appointment change due to provider template change

## 2021-11-08 ENCOUNTER — Encounter: Payer: BC Managed Care – PPO | Admitting: Nurse Practitioner

## 2021-12-20 ENCOUNTER — Telehealth: Payer: Self-pay | Admitting: *Deleted

## 2021-12-20 NOTE — Patient Outreach (Signed)
  Care Coordination   12/20/2021 Name: Jacob Wong MRN: [id number] DOB: 24-Apr-1985   Care Coordination Outreach Attempts:  An unsuccessful telephone outreach was attempted today to offer the patient information about available care coordination services as a benefit of their health plan.   Follow Up Plan:  Additional outreach attempts will be made to offer the patient care coordination information and services.   Encounter Outcome:  No Answer  Care Coordination Interventions Activated:  No   Care Coordination Interventions:  No, not indicated    Jacqlyn Larsen [facility] [place] RN Care Coordinator [phone]

## 2021-12-21 NOTE — Progress Notes (Signed)
Chief Complaint  Patient presents with   Follow-up    Pt in room #1 and alone. Pt here today for f/u Zeposia for his MS.    HISTORY OF PRESENT ILLNESS:  12/26/21 ALL:  Jacob Wong is a 36 y.o. male here today for follow up for multiple sclerosis. He continues Zeposia. He is tolerating medication well. Labs have been stable. MRI brain, cervical and thoracic spine stable 07/2020.   He reports doing well. No new or exacerbating symptoms. No changes in gait. No falls. He is working full time. He works 12 hour shifts, 2 on, 2 off.   He denies paresthesias. Headaches are better. He rarely takes [employer].   He is sleeping fairly well. Mood is good. No bowel/bladder changes. No vision changes. He does not take vitamin D OTC. He is followed by PCP regularly and working on [employer].    HISTORY (copied from Dr Garth Bigness previous note)  Jacob Pressleyis a 36 y.o. man with relapsing remitting  MS.   Update 06/22/2021: He is on Zeposia and doing well.He tolerates it well.    He denies any exacerbations.  He has no trouble with tolerability.   He denies any MS exacerbations or any significant new neurologic symptoms.   He is walking well and has no trouble with stairs.   Strength and sensation are normal in his limbs.  The left arm numbness resolved.   He has no more episodes of phasic spasms in the left arm and denies any spasticity.  No ataxia.     Bladder function is fine.   Vision is doing well.   He notes some fatigue at times.  However, he does work third shift as a Glass blower/designer.  On his days off he tries to align his schedule with others more.   He is sleeping well now.    He denies any issues with mood or cognition.    He has 4 kids age 54, 60 (twins) and 42 .   He has only rare migraine headaches .  When 1 occurs, he has nausea, phottphobia and phonophobia.    He takes a Goody's powder (no more than a couple times a month   Vitamin D with low at diagnosis (20.5) he took  50,000 units weekly for 6 months and is now taking OTC supplements     MS History (obtained 06/25/2019): He presented to the [facility] emergency room on 06/02/2019 with several days of left sided tingling and cramping.   His hand would cramp into a fist.    He had phasic spasming in the hand for about 15 - 20 seconds, often after standing up.     In the hospital, he had MRI of the brain and cervical spine and an LP.   He received 5 days of IV Solu-Medrol.    He did not improve until a few days after discharge and now feels close to baseline.   In retrospect, he has not had other episodes of neurologic dysfunction.   He was started on Zeposia in April 2021.   Imaging:  MRI of the brain 06/02/2019 showed multiple T2/FLAIR hyperintense foci in the hemispheres and right lateral thalamus.  The thalamic/IC focus and 3 other foci enhanced after contrast.  The MRI of the cervical spine showed a normal spinal cord.  There was mild to moderate degenerative changes at C3-C4.      MRI of the thoracic spine 06/04/2019 showed 1 focus anteromedially adjacent to  T7   MRI of the brain, cervical spine and thoracic spine 08/04/2020 was reviewed.  MRI of the brain showed foci in the hemispheres and thalamus consistent with MS.  None of them enhance.  There were no new lesions compared to 2021.  The foci that enhanced in 2021 no longer did so.  MRI of the cervical spine shows a tiny spot to the right at C3.  That spot was actually present on the earlier MRI as well.Marland Kitchen  MRI of the thoracic spine was normal (a previous MRI had shown a single focus).   Labs:  While in the hospital he had a lumbar puncture performed.  There were 10 oligoclonal bands and the IgG index was markedly elevated to 2.3.  Vitamin D was mildly low.  HIV and the neuromyelitis optica antibody was negative.   He has no FH of MS but does not know all his relatives.    REVIEW OF SYSTEMS: Out of a complete 14 system review of symptoms, the patient complains  only of the following symptoms, headaches, and all other reviewed systems are negative.    ALLERGIES: Allergies  Allergen Reactions   Amoxicillin Rash    Did it involve swelling of the face/tongue/throat, SOB, or low BP? No Did it involve sudden or severe rash/hives, skin peeling, or any reaction on the inside of your mouth or nose? Yes Did you need to seek medical attention at a hospital or doctor's office? Yes When did it last happen?      Pt was a child If all above answers are "NO", may proceed with cephalosporin use.      HOME MEDICATIONS: Outpatient Medications Prior to Visit  Medication Sig Dispense Refill   Ozanimod HCl (ZEPOSIA) 0.92 MG CAPS MAINTENANCE DOSE: TAKE 1 CAPSULE BY MOUTH 1 TIME A DAY. 90 capsule 3   No facility-administered medications prior to visit.     PAST MEDICAL HISTORY: History reviewed. No pertinent past medical history.   PAST SURGICAL HISTORY: History reviewed. No pertinent surgical history.   FAMILY HISTORY: Family History  Problem Relation Age of Onset   Hypertension Mother    Hypertension Father      SOCIAL HISTORY: Social History   Socioeconomic History   Marital status: Single    Spouse name: Fiance: Press photographer   Number of children: 4   Years of education: Some college   Highest education level: Not on file  Occupational History   Occupation: [employer]  Tobacco Use   Smoking status: Never   Smokeless tobacco: Never  Substance and Sexual Activity   Alcohol use: Yes    Comment: socially   Drug use: Never   Sexual activity: Yes  Other Topics Concern   Not on file  Social History Narrative   Coffee/energy drinks sometimes   Lives with fiance and 4 children   Right handed    Social Determinants of Health   Financial Resource Strain: Not on file  Food Insecurity: Not on file  Transportation Needs: Not on file  Physical Activity: Not on file  Stress: Not on file  Social Connections: Not on file   Intimate Partner Violence: Not on file      PHYSICAL EXAM  Vitals:   12/26/21 0805  BP: (!) 154/97  Pulse: 85  Weight: 277 lb 8 oz (125.9 kg)  Height: 6\' 2"  (1.88 m)    Body mass index is 35.63 kg/m.   Generalized: Well developed, in no acute distress  Cardiology: normal  rate and rhythm, no murmur auscultated  Respiratory: clear to auscultation bilaterally    Neurological examination  Mentation: Alert oriented to time, place, history taking. Follows all commands speech and language fluent Cranial nerve II-XII: Pupils were equal round reactive to light. Extraocular movements were full, visual field were full on confrontational test. Facial sensation and strength were normal.  Head turning and shoulder shrug  were normal and symmetric. Motor: The motor testing reveals 5 over 5 strength of all 4 extremities. Good symmetric motor tone is noted throughout.  Sensory: Sensory testing is intact to soft touch, temperature and vibration on all 4 extremities. No evidence of extinction is noted.  Coordination: Cerebellar testing reveals good finger-nose-finger and heel-to-shin bilaterally.  Gait and station: Gait is normal.  Reflexes: Deep tendon reflexes are symmetric and normal bilaterally.     DIAGNOSTIC DATA (LABS, IMAGING, TESTING) - I reviewed patient records, labs, notes, testing and imaging myself where available.  Lab Results  Component Value Date   WBC 4.0 06/22/2021   HGB 13.7 06/22/2021   HCT 41.1 06/22/2021   MCV 93 06/22/2021   PLT 261 06/22/2021      Component Value Date/Time   NA 142 06/30/2020 1540   K 4.3 06/30/2020 1540   CL 104 06/30/2020 1540   CO2 25 06/30/2020 1540   GLUCOSE 89 06/30/2020 1540   GLUCOSE 100 (H) 06/02/2019 1144   BUN 12 06/30/2020 1540   CREATININE 1.03 06/30/2020 1540   CALCIUM 9.3 06/30/2020 1540   PROT 6.6 06/22/2021 0901   ALBUMIN 4.3 06/22/2021 0901   AST 32 06/22/2021 0901   ALT 45 (H) 06/22/2021 0901   ALKPHOS 75  06/22/2021 0901   BILITOT 0.5 06/22/2021 0901   GFRNONAA 111 06/12/2019 1134   GFRAA 128 06/12/2019 1134   Lab Results  Component Value Date   CHOL 196 06/12/2019   HDL 60 06/12/2019   LDLCALC 122 (H) 06/12/2019   TRIG 78 06/12/2019   CHOLHDL 3.3 06/12/2019   Lab Results  Component Value Date   HGBA1C 5.1 12/28/2020   Lab Results  Component Value Date   SJGGEZMO29 476 06/12/2019   Lab Results  Component Value Date   TSH 1.130 12/28/2020        No data to display               No data to display           ASSESSMENT AND PLAN  36 y.o. year old male  has no past medical history on file. here with   Multiple sclerosis ([place]) - Plan: CBC with Differential/Platelets, Hepatic Function Panel  High risk medication use  Migraine without aura and without status migrainosus, not intractable  Vitamin D deficiency - Plan: Vitamin D, 25-hydroxy   Jacob Wong is doing very well. He is tolerating Zeposia and denies new or exacerbating symptoms. We will update labs. Imaging stable 07/2020. He was encouraged to focus on healthy lifestyle habits. He will notify me of any new or worsening symptoms. He will follow up with Dr Felecia Shelling in 6 months, sooner if needed. He verbalizes understanding and agreement with this plan.    Orders Placed This Encounter  Procedures   CBC with Differential/Platelets   Hepatic Function Panel   Vitamin D, 25-hydroxy     No orders of the defined types were placed in this encounter.    Debbora Presto, MSN, FNP-C 12/26/2021, 8:21 AM  [facility] [address] [place], [place] [address] [phone]  [phone] 

## 2021-12-21 NOTE — Patient Instructions (Signed)
Below is our plan:  We will continue Zeposia. I will update labs.   Please make sure you are staying well hydrated. I recommend 50-60 ounces daily. Well balanced diet and regular exercise encouraged. Consistent sleep schedule with 6-8 hours recommended.   Please continue follow up with care team as directed.   Follow up with Dr Felecia Shelling in 6 months   You may receive a survey regarding today's visit. I encourage you to leave honest feed back as I do use this information to improve patient care. Thank you for seeing me today!

## 2021-12-22 ENCOUNTER — Telehealth: Payer: Self-pay | Admitting: *Deleted

## 2021-12-22 NOTE — Patient Outreach (Signed)
  Care Coordination   12/22/2021 Name: Dominyck Reser MRN: [id number] DOB: April 03, 1985   Care Coordination Outreach Attempts:  An unsuccessful telephone outreach was attempted today to offer the patient information about available care coordination services as a benefit of their health plan.   Follow Up Plan:  Additional outreach attempts will be made to offer the patient care coordination information and services.   Encounter Outcome:  No Answer  Care Coordination Interventions Activated:  No   Care Coordination Interventions:  No, not indicated    SIG Athena Baltz C. Myrtie Neither, MSN, [facility] Gerontological Nurse Practitioner [facility] Care Management [phone]

## 2021-12-23 ENCOUNTER — Telehealth: Payer: Self-pay | Admitting: *Deleted

## 2021-12-23 NOTE — Patient Outreach (Signed)
  Care Coordination   12/23/2021 Name: Jacob Wong MRN: [id number] DOB: February 05, 1986   Care Coordination Outreach Attempts:  A third unsuccessful outreach was attempted today to offer the patient with information about available care coordination services as a benefit of their health plan.  Left another message to return NP call.  Follow Up Plan:  No further outreach attempts will be made at this time. We have been unable to contact the patient to offer or enroll patient in care coordination services  Encounter Outcome:  No Answer  Care Coordination Interventions Activated:  No   Care Coordination Interventions:  No, not indicated    SIG Danil Wedge C. Myrtie Neither, MSN, [facility] Gerontological Nurse Practitioner [facility] Care Management [phone]

## 2021-12-26 ENCOUNTER — Encounter: Payer: Self-pay | Admitting: Family Medicine

## 2021-12-26 ENCOUNTER — Ambulatory Visit (INDEPENDENT_AMBULATORY_CARE_PROVIDER_SITE_OTHER): Payer: BC Managed Care – PPO | Admitting: Family Medicine

## 2021-12-26 ENCOUNTER — Ambulatory Visit: Payer: BC Managed Care – PPO | Admitting: Family Medicine

## 2021-12-26 VITALS — BP 154/97 | HR 85 | Ht 74.0 in | Wt 277.5 lb

## 2021-12-26 DIAGNOSIS — E559 Vitamin D deficiency, unspecified: Secondary | ICD-10-CM | POA: Diagnosis not present

## 2021-12-26 DIAGNOSIS — Z79899 Other long term (current) drug therapy: Secondary | ICD-10-CM

## 2021-12-26 DIAGNOSIS — G35 Multiple sclerosis: Secondary | ICD-10-CM | POA: Diagnosis not present

## 2021-12-26 DIAGNOSIS — G43009 Migraine without aura, not intractable, without status migrainosus: Secondary | ICD-10-CM

## 2021-12-27 LAB — CBC WITH DIFFERENTIAL/PLATELET
Basophils Absolute: 0 10*3/uL (ref 0.0–0.2)
Basos: 1 %
EOS (ABSOLUTE): 0.1 10*3/uL (ref 0.0–0.4)
Eos: 4 %
Hematocrit: 42.2 % (ref 37.5–51.0)
Hemoglobin: 14.3 g/dL (ref 13.0–17.7)
Immature Grans (Abs): 0 10*3/uL (ref 0.0–0.1)
Immature Granulocytes: 0 %
Lymphocytes Absolute: 0.5 10*3/uL — ABNORMAL LOW (ref 0.7–3.1)
Lymphs: 15 %
MCH: 31.4 pg (ref 26.6–33.0)
MCHC: 33.9 g/dL (ref 31.5–35.7)
MCV: 93 fL (ref 79–97)
Monocytes Absolute: 0.5 10*3/uL (ref 0.1–0.9)
Monocytes: 16 %
Neutrophils Absolute: 2.1 10*3/uL (ref 1.4–7.0)
Neutrophils: 64 %
Platelets: 206 10*3/uL (ref 150–450)
RBC: 4.56 x10E6/uL (ref 4.14–5.80)
RDW: 11.9 % (ref 11.6–15.4)
WBC: 3.3 10*3/uL — ABNORMAL LOW (ref 3.4–10.8)

## 2021-12-27 LAB — HEPATIC FUNCTION PANEL
ALT: 56 IU/L — ABNORMAL HIGH (ref 0–44)
AST: 45 IU/L — ABNORMAL HIGH (ref 0–40)
Albumin: 4.3 g/dL (ref 4.1–5.1)
Alkaline Phosphatase: 88 IU/L (ref 44–121)
Bilirubin Total: 0.4 mg/dL (ref 0.0–1.2)
Bilirubin, Direct: 0.13 mg/dL (ref 0.00–0.40)
Total Protein: 6.3 g/dL (ref 6.0–8.5)

## 2021-12-27 LAB — VITAMIN D 25 HYDROXY (VIT D DEFICIENCY, FRACTURES): Vit D, 25-Hydroxy: 17.2 ng/mL — ABNORMAL LOW (ref 30.0–100.0)

## 2022-01-05 IMAGING — MR MR HEAD WO/W CM
6 of 8 series · 33 of 48 positions shown · IV contrast (gadavist)
Comparison: No pertinent prior studies available for comparison.

CLINICAL DATA: Intermittent left upper extremity/left lower
extremity paresthesias; numbness or tingling, paresthesias.
Additional provided: Patient reports left arm pain and left leg
pain, tingling.

EXAM:
MRI HEAD WITHOUT AND WITH CONTRAST
MRI CERVICAL SPINE WITHOUT AND WITH CONTRAST
TECHNIQUE: Multiplanar, multiecho pulse sequences of the brain and surrounding
structures, and cervical spine, to include the craniocervical
junction and cervicothoracic junction, were obtained without and
with intravenous contrast.
CONTRAST:  10mL GADAVIST GADOBUTROL 1 MMOL/ML IV SOLN

[Series 7: DWI · coronal · 4.0mm · 0.88mm/px · 9 of 72 slices shown (1 of 2)]
[im 1/72]
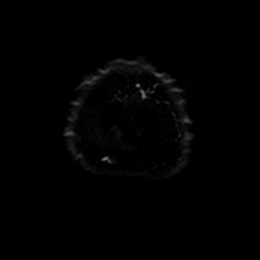
[im 13/72]
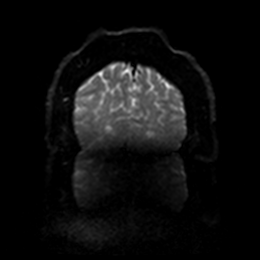
[im 20/72]
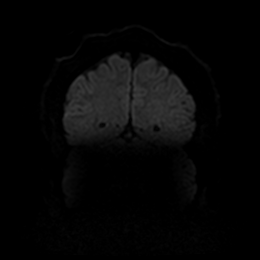
[im 33/72]
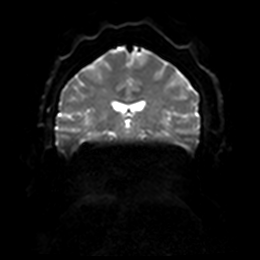
[im 39/72]
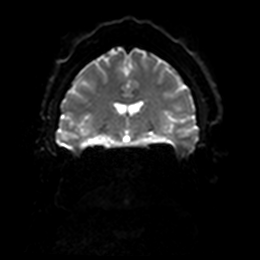
[im 52/72]
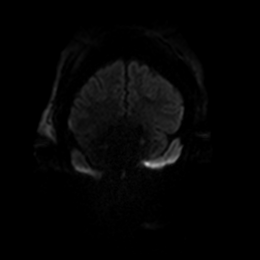
[im 59/72]
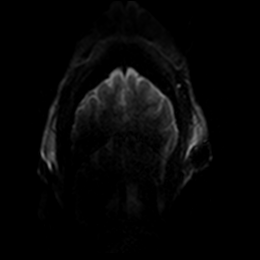
[im 65/72]
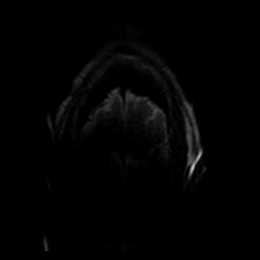
[im 72/72]
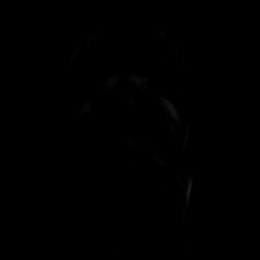

[Series 8: DWI · coronal · 4.0mm · 0.88mm/px · 6 of 36 slices shown (2 of 2)]
[im 1/36]
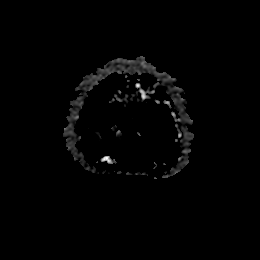
[im 8/36]
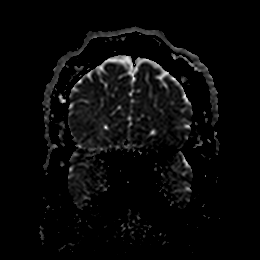
[im 15/36]
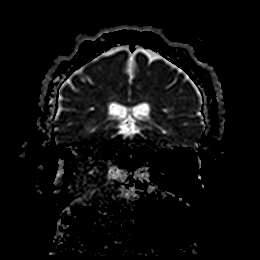
[im 22/36]
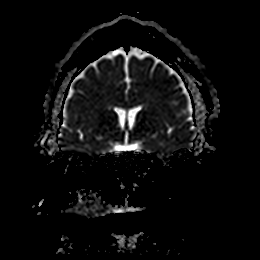
[im 29/36]
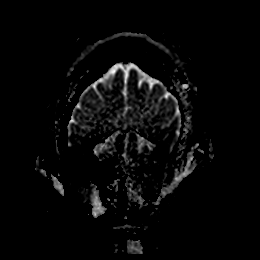
[im 36/36]
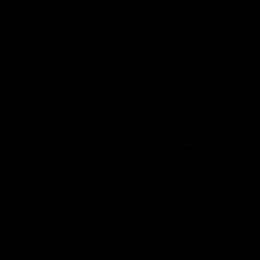

[Series 10: T2 · axial · 5.0mm · 0.72mm/px · z∈[-66,+78]mm · 4 of 25 slices shown (1 of 3)]
[im 1/25]
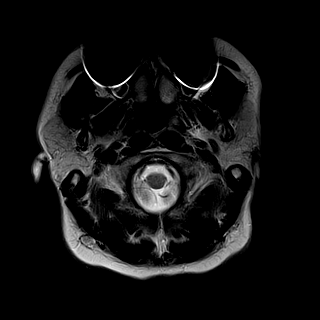
[im 9/25]
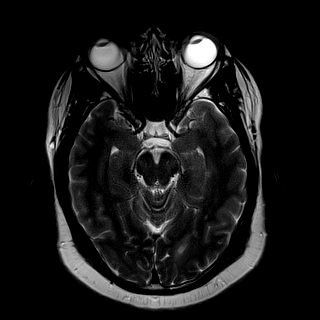
[im 17/25]
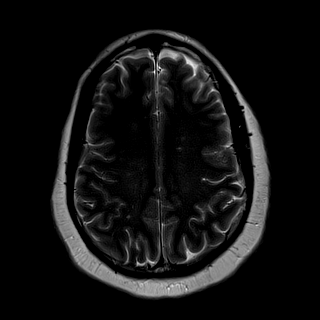
[im 25/25]
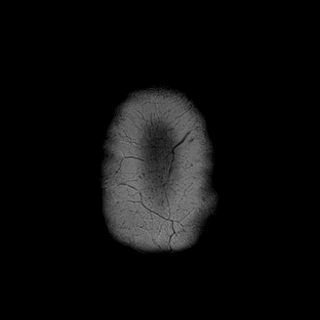

[Series 17: T2 · coronal · 5.0mm · 0.72mm/px · 5 of 28 slices shown (2 of 3)]
[im 1/28]
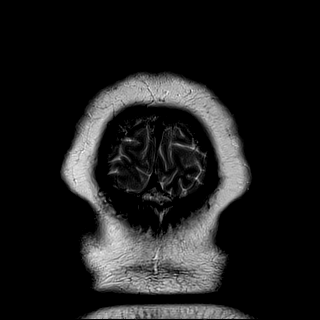
[im 7/28]
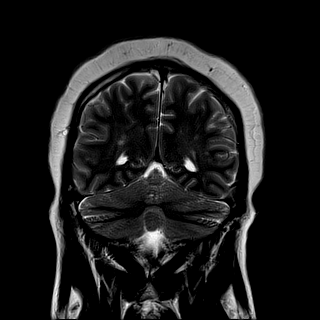
[im 14/28]
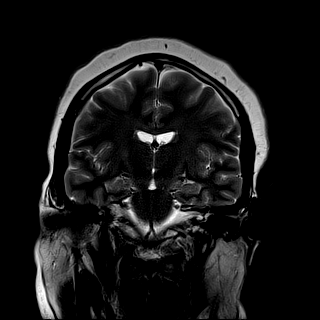
[im 21/28]
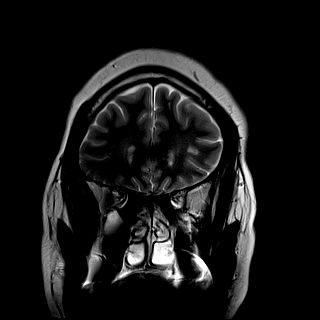
[im 28/28]
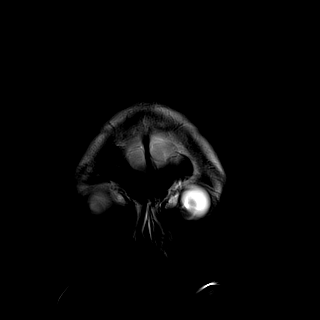

[Series 23: T2 · sagittal · 3.0mm · 0.69mm/px · 2 of 15 slices shown (3 of 3)]
[im 1/15]
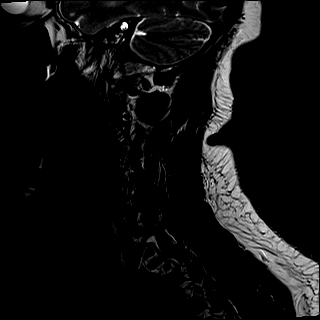
[im 15/15]
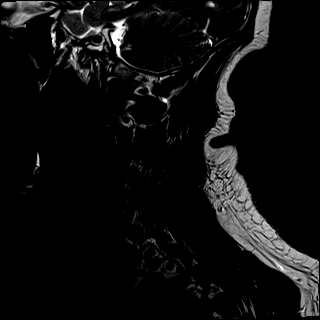

[Series 27: GRE · axial · 3.0mm · 0.39mm/px · z∈[-233,-106]mm · 7 of 40 slices shown]
[im 1/40]
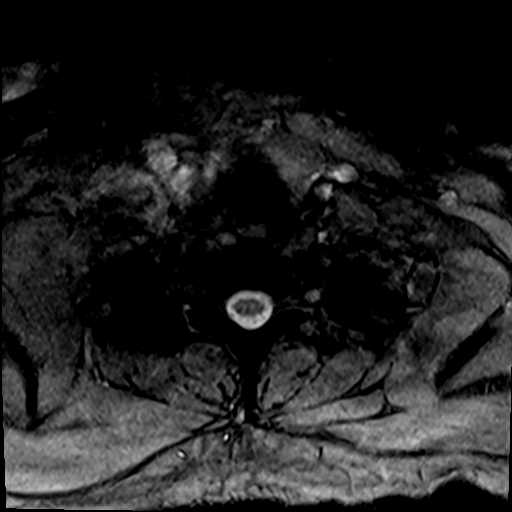
[im 7/40]
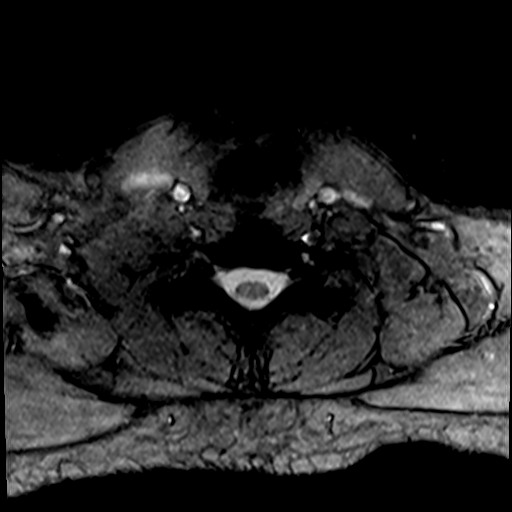
[im 14/40]
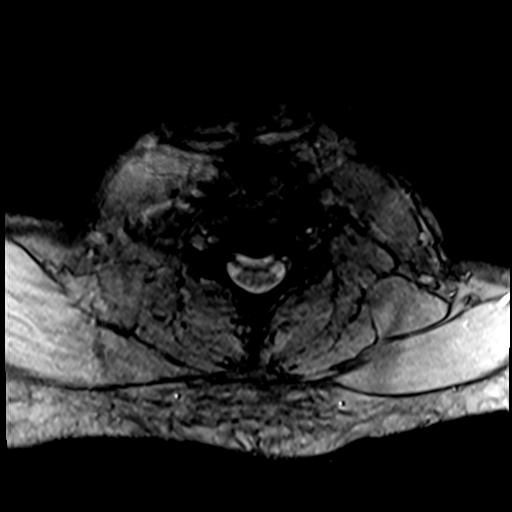
[im 20/40]
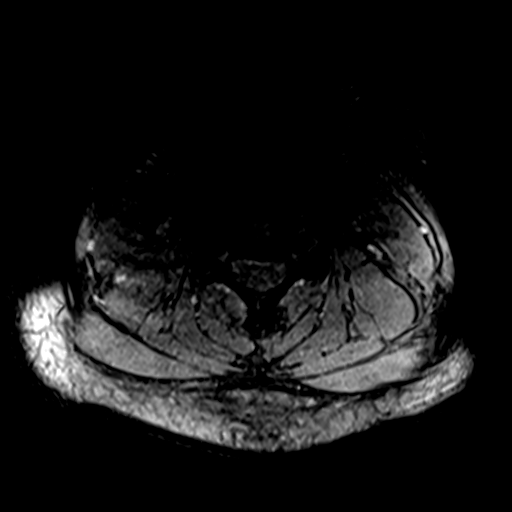
[im 27/40]
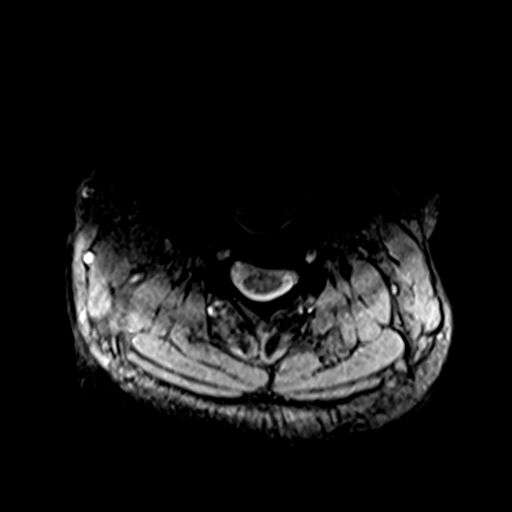
[im 33/40]
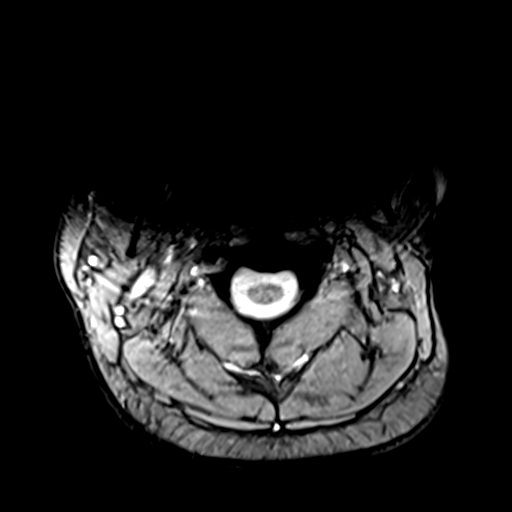
[im 40/40]
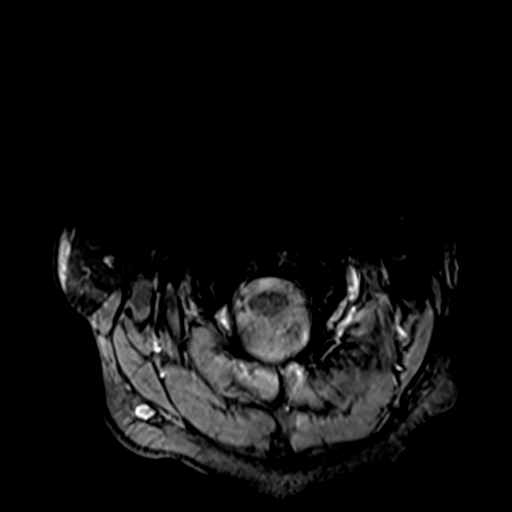

[33 of 48 positions shown; findings below may reference images not displayed]

FINDINGS: MRI HEAD FINDINGS

Brain:

Susceptibility artifact from orthodontic hardware obscures anterior
portions of the intracranial contents on multiple sequences, most
notably on the diffusion-weighted and SWI sequences. Additionally,
portions of the posterior fossa are obscured on several sequences.

There are moderate scattered multifocal T2/FLAIR hyperintense signal
changes within the subcortical/juxtacortical as well as deep
periventricular white matter. The largest lesions are located within
the left frontal lobe (10 mm on image 14 of series 11), within the
right periatrial white matter (10 mm on image 14 of series 11) and
within the right thalamocapsular junction (8 mm on image 13 of
series 11). Several of these lesions demonstrate corresponding
restricted diffusion and/or abnormal enhancement. There is
enhancement of the lesion within the right thalamocapsular junction
(series 33, image 13). There is also enhancement at sites of small
lesions within the right frontal lobe (series 32, image 36) and left
frontoparietal lobe (series 32, image 36), as well as within the
anterior left temporal lobe (series 32, image 22). Several small
lesions along the margin of the posterior left lateral ventricle
demonstrate restricted diffusion (series 5, image 78) (series 5,
image 76) (series 5, image 74). Additional lesion showing restricted
diffusion within the high right frontal lobe (series 5, image 85).

There is no evidence of an intracranial mass. No midline shift or
extra-axial fluid collection. No chronic intracranial blood products
are identified within described limitations.

Vascular: Flow voids maintained within the proximal large arterial
vessels.

Skull and upper cervical spine: No focal marrow lesion.

Sinuses/Orbits: Visualized orbits demonstrate no acute abnormality.
Mild right maxillary sinus mucosal thickening. No significant
mastoid effusion.

MRI CERVICAL SPINE FINDINGS

Alignment: Straightening of the expected cervical lordosis. No
significant spondylolisthesis.

Vertebrae: Vertebral body height is maintained. Schmorl node within
the C3 inferior endplate. Mixed degenerative endplate marrow signal
at C3-C4. This includes moderate degenerative endplate edema as well
as degenerative endplate enhancement at C3-C4.

Cord: No spinal cord signal abnormality or abnormal cord
enhancement.

Posterior Fossa, vertebral arteries, paraspinal tissues: Posterior
fossa better assessed on concurrently performed brain MRI. Flow
voids preserved within the imaged cervical vertebral arteries.
Paraspinal soft tissues within normal limits.

Disc levels:

Mild-to-moderate C3-C4 disc degeneration. No more than mild disc
degeneration at the remaining levels.

C2-C3: No disc herniation. No significant canal or foraminal
stenosis.

C3-C4: No significant disc herniation or spinal canal stenosis.
Left-sided disc osteophyte ridge/uncinate hypertrophy with resultant
mild to moderate left neural foraminal narrowing.

C4-C5: No disc herniation. No significant canal or foraminal
stenosis.

C5-C6: No disc herniation. No significant canal or foraminal
stenosis.

C6-C7: No disc herniation. No significant canal or foraminal
stenosis.

C7-T1: No disc herniation. No significant canal or foraminal
stenosis.
IMPRESSION: MRI brain:

1. Examination limited by susceptibility artifact arising from
orthodontic hardware.
2. Multifocal T2 hyperintense signal changes within the cerebral
white matter, several of which demonstrate corresponding restricted
diffusion and enhancement. This includes an 8 mm enhancing lesion
within the right thalamocapsular junction which likely explains the
patient's reported symptoms. Findings are not entirely specific but
highly suggestive of demyelinating disease with active
demyelination.

MRI cervical spine:

1. No cervical spinal cord signal abnormality or abnormal cord
enhancement.
2. At C3-C4, there is mild-to-moderate disc degeneration with C3
inferior endplate Schmorl node. Degenerative endplate edema and
enhancement. Left-sided disc osteophyte ridge/uncinate hypertrophy
contributes to mild/moderate left neural foraminal narrowing.
3. No significant disc herniation, spinal canal stenosis or neural
foraminal narrowing at the remaining levels.

## 2022-01-05 IMAGING — MR MR CERVICAL SPINE WO/W CM
2 series · 16 of 48 positions shown · IV contrast (gadavist)
Comparison: No pertinent prior studies available for comparison.

CLINICAL DATA: Intermittent left upper extremity/left lower
extremity paresthesias; numbness or tingling, paresthesias.
Additional provided: Patient reports left arm pain and left leg
pain, tingling.

EXAM:
MRI HEAD WITHOUT AND WITH CONTRAST
MRI CERVICAL SPINE WITHOUT AND WITH CONTRAST
TECHNIQUE: Multiplanar, multiecho pulse sequences of the brain and surrounding
structures, and cervical spine, to include the craniocervical
junction and cervicothoracic junction, were obtained without and
with intravenous contrast.
CONTRAST:  10mL GADAVIST GADOBUTROL 1 MMOL/ML IV SOLN

[Series 1054: mpr 2mm range · coronal · 2.0mm · 0.49mm/px · 9 of 96 slices shown]
[im 4/96]
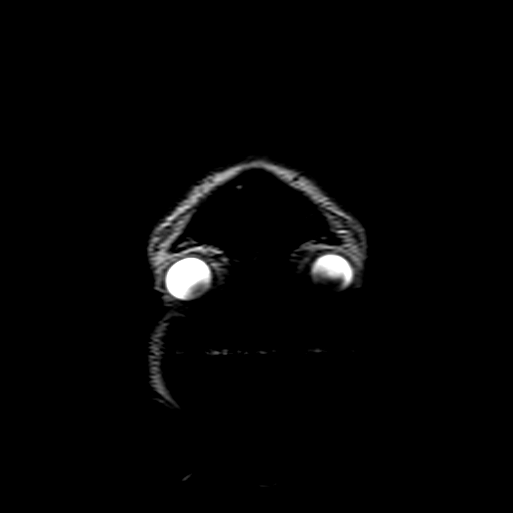
[im 16/96]
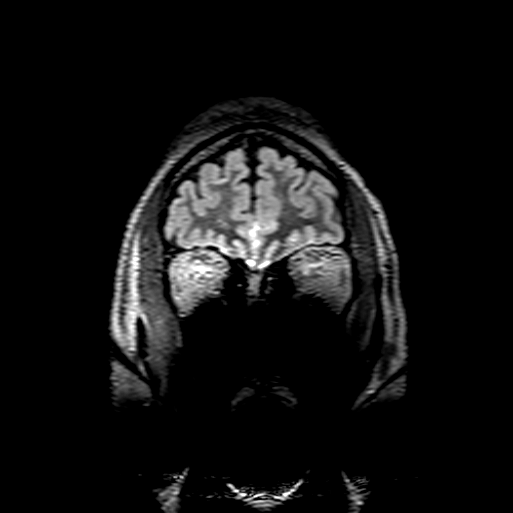
[im 31/96]
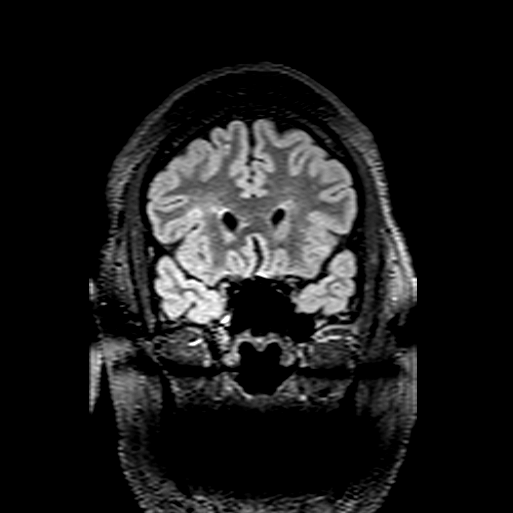
[im 42/96]
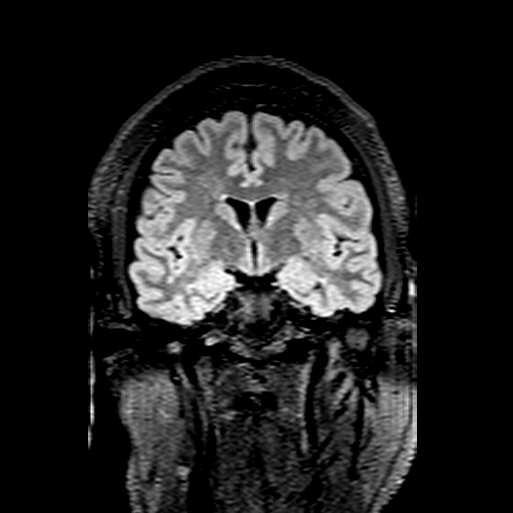
[im 50/96]
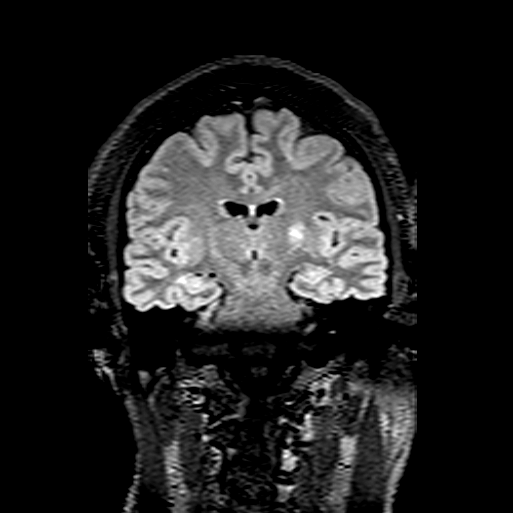
[im 54/96]
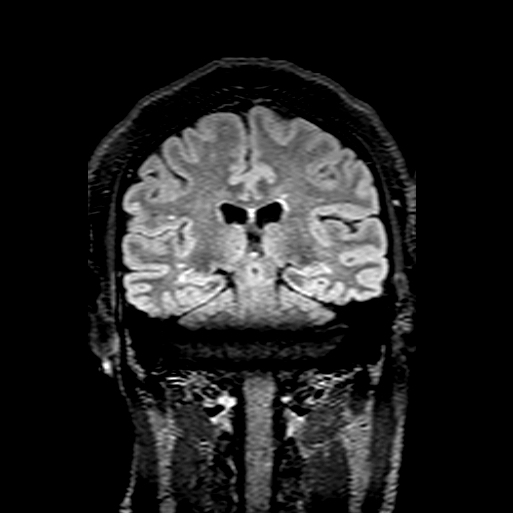
[im 65/96]
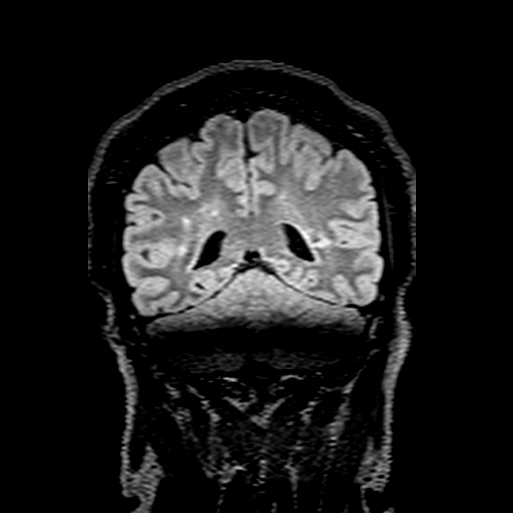
[im 80/96]
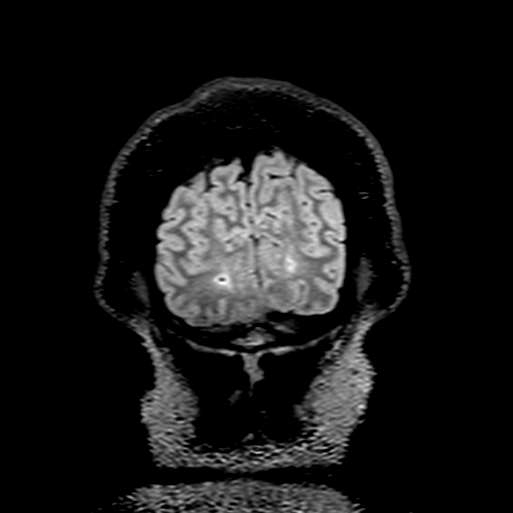
[im 92/96]
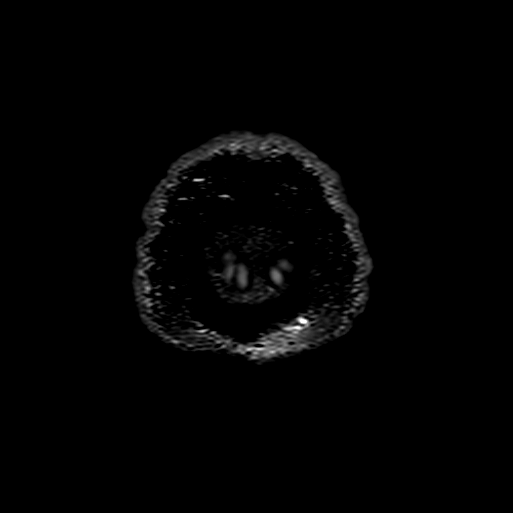

[Series 1060: mpr 2mm range(2) · axial · 2.0mm · 0.49mm/px · z∈[-74,+62]mm · 7 of 84 slices shown]
[im 4/84]
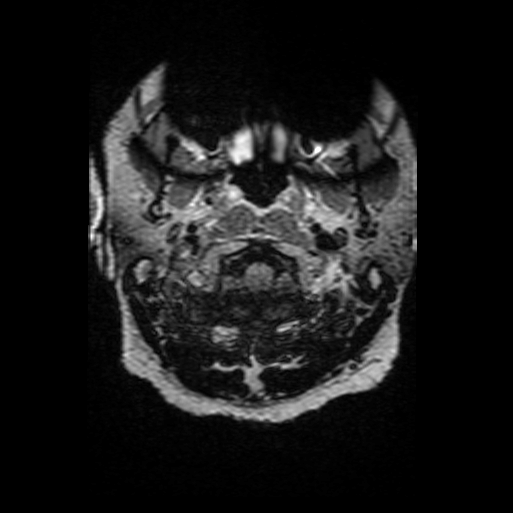
[im 12/84]
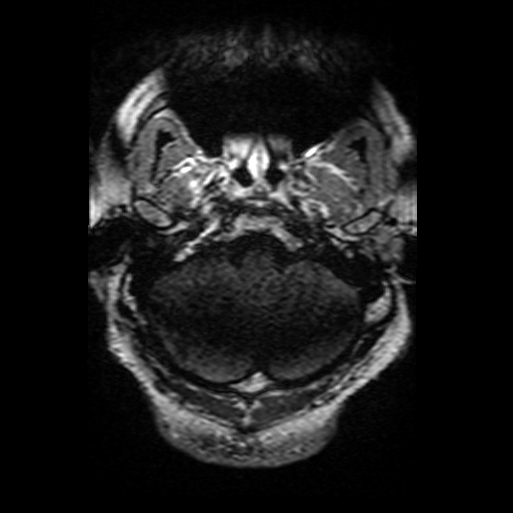
[im 16/84]
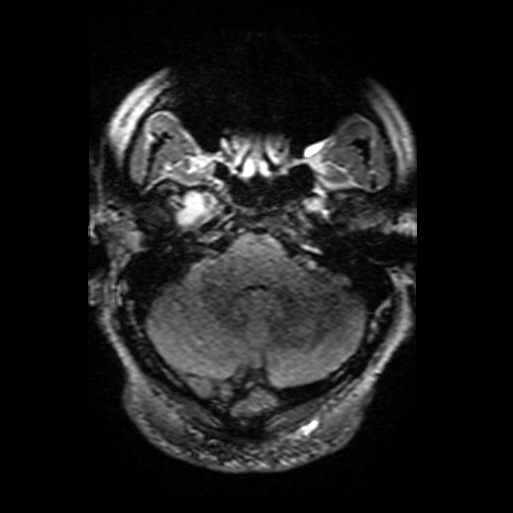
[im 24/84]
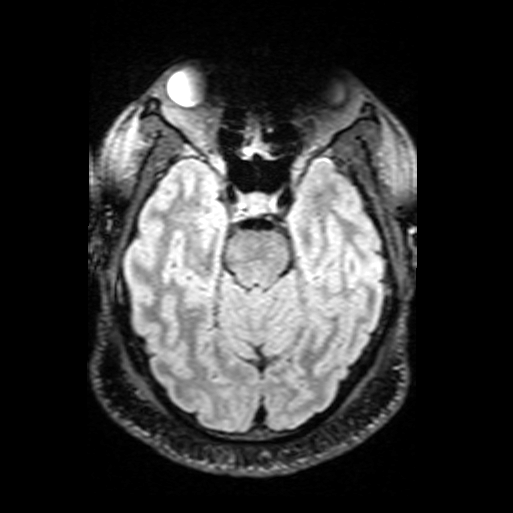
[im 36/84]
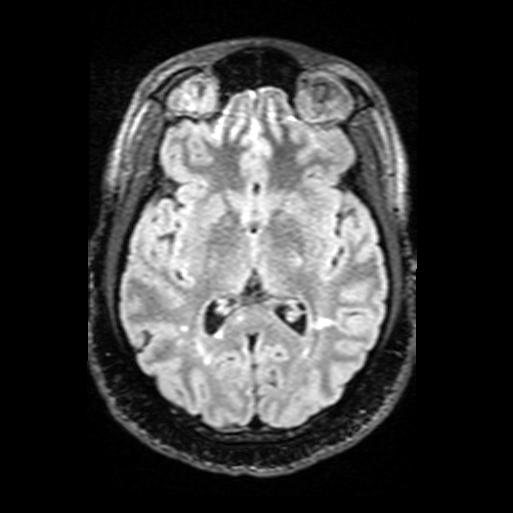
[im 44/84]
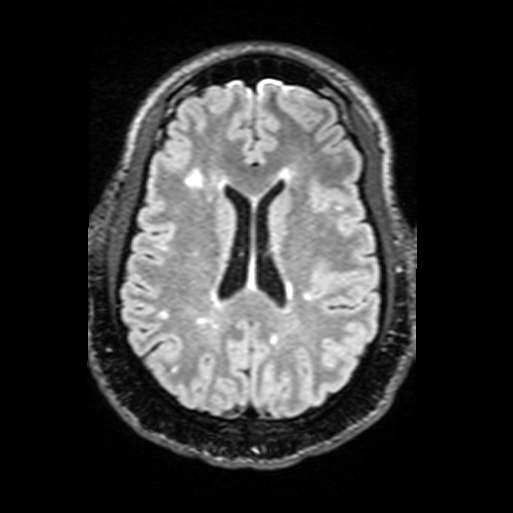
[im 72/84]
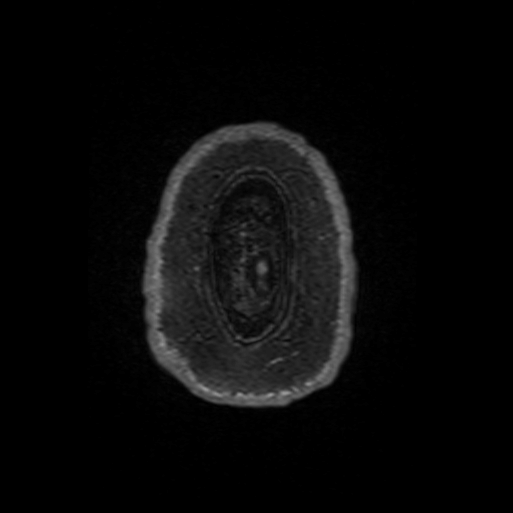

[16 of 48 positions shown; findings below may reference images not displayed]

FINDINGS: MRI HEAD FINDINGS

Brain:

Susceptibility artifact from orthodontic hardware obscures anterior
portions of the intracranial contents on multiple sequences, most
notably on the diffusion-weighted and SWI sequences. Additionally,
portions of the posterior fossa are obscured on several sequences.

There are moderate scattered multifocal T2/FLAIR hyperintense signal
changes within the subcortical/juxtacortical as well as deep
periventricular white matter. The largest lesions are located within
the left frontal lobe (10 mm on image 14 of series 11), within the
right periatrial white matter (10 mm on image 14 of series 11) and
within the right thalamocapsular junction (8 mm on image 13 of
series 11). Several of these lesions demonstrate corresponding
restricted diffusion and/or abnormal enhancement. There is
enhancement of the lesion within the right thalamocapsular junction
(series 33, image 13). There is also enhancement at sites of small
lesions within the right frontal lobe (series 32, image 36) and left
frontoparietal lobe (series 32, image 36), as well as within the
anterior left temporal lobe (series 32, image 22). Several small
lesions along the margin of the posterior left lateral ventricle
demonstrate restricted diffusion (series 5, image 78) (series 5,
image 76) (series 5, image 74). Additional lesion showing restricted
diffusion within the high right frontal lobe (series 5, image 85).

There is no evidence of an intracranial mass. No midline shift or
extra-axial fluid collection. No chronic intracranial blood products
are identified within described limitations.

Vascular: Flow voids maintained within the proximal large arterial
vessels.

Skull and upper cervical spine: No focal marrow lesion.

Sinuses/Orbits: Visualized orbits demonstrate no acute abnormality.
Mild right maxillary sinus mucosal thickening. No significant
mastoid effusion.

MRI CERVICAL SPINE FINDINGS

Alignment: Straightening of the expected cervical lordosis. No
significant spondylolisthesis.

Vertebrae: Vertebral body height is maintained. Schmorl node within
the C3 inferior endplate. Mixed degenerative endplate marrow signal
at C3-C4. This includes moderate degenerative endplate edema as well
as degenerative endplate enhancement at C3-C4.

Cord: No spinal cord signal abnormality or abnormal cord
enhancement.

Posterior Fossa, vertebral arteries, paraspinal tissues: Posterior
fossa better assessed on concurrently performed brain MRI. Flow
voids preserved within the imaged cervical vertebral arteries.
Paraspinal soft tissues within normal limits.

Disc levels:

Mild-to-moderate C3-C4 disc degeneration. No more than mild disc
degeneration at the remaining levels.

C2-C3: No disc herniation. No significant canal or foraminal
stenosis.

C3-C4: No significant disc herniation or spinal canal stenosis.
Left-sided disc osteophyte ridge/uncinate hypertrophy with resultant
mild to moderate left neural foraminal narrowing.

C4-C5: No disc herniation. No significant canal or foraminal
stenosis.

C5-C6: No disc herniation. No significant canal or foraminal
stenosis.

C6-C7: No disc herniation. No significant canal or foraminal
stenosis.

C7-T1: No disc herniation. No significant canal or foraminal
stenosis.
IMPRESSION: MRI brain:

1. Examination limited by susceptibility artifact arising from
orthodontic hardware.
2. Multifocal T2 hyperintense signal changes within the cerebral
white matter, several of which demonstrate corresponding restricted
diffusion and enhancement. This includes an 8 mm enhancing lesion
within the right thalamocapsular junction which likely explains the
patient's reported symptoms. Findings are not entirely specific but
highly suggestive of demyelinating disease with active
demyelination.

MRI cervical spine:

1. No cervical spinal cord signal abnormality or abnormal cord
enhancement.
2. At C3-C4, there is mild-to-moderate disc degeneration with C3
inferior endplate Schmorl node. Degenerative endplate edema and
enhancement. Left-sided disc osteophyte ridge/uncinate hypertrophy
contributes to mild/moderate left neural foraminal narrowing.
3. No significant disc herniation, spinal canal stenosis or neural
foraminal narrowing at the remaining levels.

## 2022-02-23 ENCOUNTER — Telehealth: Payer: Self-pay | Admitting: Nurse Practitioner

## 2022-02-23 NOTE — Telephone Encounter (Signed)
Called pt to schedule appt no answer could not leave VM  

## 2022-06-20 ENCOUNTER — Telehealth: Payer: Self-pay | Admitting: *Deleted

## 2022-06-20 NOTE — Telephone Encounter (Signed)
Faxed completed/signed PA Zeposia to [facility] at [phone]. Received fax confirmation. Waiting on determination.

## 2022-06-20 NOTE — Telephone Encounter (Signed)
Received fax from [employer] that PA approved 06/20/22-06/20/23. PA# [employer] [id number]

## 2022-06-27 ENCOUNTER — Ambulatory Visit (INDEPENDENT_AMBULATORY_CARE_PROVIDER_SITE_OTHER): Payer: BC Managed Care – PPO | Admitting: Neurology

## 2022-06-27 ENCOUNTER — Encounter: Payer: Self-pay | Admitting: Neurology

## 2022-06-27 VITALS — BP 141/96 | HR 82 | Ht 74.0 in | Wt 271.5 lb

## 2022-06-27 DIAGNOSIS — E559 Vitamin D deficiency, unspecified: Secondary | ICD-10-CM | POA: Diagnosis not present

## 2022-06-27 DIAGNOSIS — G35 Multiple sclerosis: Secondary | ICD-10-CM | POA: Diagnosis not present

## 2022-06-27 DIAGNOSIS — Z79899 Other long term (current) drug therapy: Secondary | ICD-10-CM

## 2022-06-27 NOTE — Progress Notes (Signed)
[facility]  PATIENT: Jacob Wong DOB: [birth year]  REFERRING DOCTOR OR PCP:  Janne Lab, NP SOURCE: Patient, notes from hospital admission, imaging and lab reports, MRI images personally reviewed.  _________________________________   HISTORICAL  CHIEF COMPLAINT:  Chief Complaint  Patient presents with   Room 10    Pt is here Alone. Pt states that things have been going good with his MS since last Appointment. Pt states no new symptoms to report.     HISTORY OF PRESENT ILLNESS: Jacob Pressleyis a 37 y.o. man with relapsing remitting  MS.  Update 06/27/2022: He is on Zeposia and doing well.He tolerates it well.    He denies any exacerbations.  He has no trouble with tolerability.   He denies any MS exacerbations or any significant new neurologic symptoms.  He is walking well and has no trouble with stairs.   Strength and sensation are normal in his limbs.  The left arm numbness resolved.   He has no more episodes of phasic spasms in the left arm and denies any spasticity.  No ataxia.     Bladder function is fine.   Vision is doing well.  He notes some fatigue at times.  However, he does work third shift as a Location manager.  On his days off he tries to align his schedule with others more.   He is sleeping well now.    He denies any issues with mood or cognition.    He has 4 kids age 80, 35 (twins) and 105 .   He has only rare migraine headaches - less than one a month .  When 1 occurs, he has nausea, phottphobia and phonophobia.    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     MS History (obtained 06/25/2019): He presented to the [facility] emergency room on 06/02/2019 with several days of left sided tingling and cramping.   His hand would cramp into a fist.    He had phasic spasming in the  hand for about 15 - 20 seconds, often after standing up.     In the hospital, he had MRI of the brain and cervical spine and an LP.   He received 5 days of IV Solu-Medrol.    He did not improve until a few days after discharge and now feels close to baseline.   In retrospect, he has not had other episodes of neurologic dysfunction.   He was started on Zeposia in April 2021.  Imaging:  MRI of the brain 06/02/2019 showed multiple T2/FLAIR hyperintense foci in the hemispheres and right lateral thalamus.  The thalamic/IC focus and 3 other foci enhanced after contrast.  The MRI of the cervical spine showed a normal spinal cord.  There was mild to moderate degenerative changes at C3-C4.     MRI of the thoracic spine 06/04/2019 showed 1 focus anteromedially adjacent to T7  MRI of the brain, cervical spine and thoracic spine 08/04/2020 was reviewed.  MRI of the brain showed foci in the hemispheres and thalamus consistent with MS.  None of them enhance.  There were no new lesions compared to 2021.  The foci that enhanced in 2021 no longer did so.    MRI of the cervical spine shows a small spot to  the left at C3.   That spot was actually present on the earlier MRI as well..  MRI of the thoracic spine was normal (a previous MRI had shown a single focus).  Labs:  While in the hospital he had a lumbar puncture performed.  There were 10 oligoclonal bands and the IgG index was markedly elevated to 2.3.  Vitamin D was mildly low.  HIV and the neuromyelitis optica antibody was negative.  He has no FH of MS but does not know all his relatives.      REVIEW OF SYSTEMS: Constitutional: No fevers, chills, sweats, or change in appetite Eyes: No visual changes, double vision, eye pain Ear, nose and throat: No hearing loss, ear pain, nasal congestion, sore throat Cardiovascular: No chest pain, palpitations Respiratory:  No shortness of breath at rest or with exertion.   No wheezes GastrointestinaI: No nausea, vomiting,  diarrhea, abdominal pain, fecal incontinence Genitourinary:  No dysuria, urinary retention or frequency.  No nocturia. Musculoskeletal:  No neck pain, back pain Integumentary: No rash, pruritus, skin lesions Neurological: as above Psychiatric: No depression at this time.  No anxiety Endocrine: No palpitations, diaphoresis, change in appetite, change in weigh or increased thirst Hematologic/Lymphatic:  No anemia, purpura, petechiae. Allergic/Immunologic: No itchy/runny eyes, nasal congestion, recent allergic reactions, rashes  ALLERGIES: Allergies  Allergen Reactions   Amoxicillin Rash    Did it involve swelling of the face/tongue/throat, SOB, or low BP? No Did it involve sudden or severe rash/hives, skin peeling, or any reaction on the inside of your mouth or nose? Yes Did you need to seek medical attention at a hospital or doctor's office? Yes When did it last happen?      Pt was a child If all above answers are "NO", may proceed with cephalosporin use.     HOME MEDICATIONS:  Current Outpatient Medications:    Ozanimod HCl (ZEPOSIA) 0.92 MG CAPS, MAINTENANCE DOSE: TAKE 1 CAPSULE BY MOUTH 1 TIME A DAY., Disp: 90 capsule, Rfl: 3  PAST MEDICAL HISTORY: History reviewed. No pertinent past medical history.  PAST SURGICAL HISTORY: History reviewed. No pertinent surgical history.  FAMILY HISTORY: Family History  Problem Relation Age of Onset   Hypertension Mother    Hypertension Father     SOCIAL HISTORY:  Social History   Socioeconomic History   Marital status: Single    Spouse name: Fiance: Regulatory affairs officer   Number of children: 4   Years of education: Some college   Highest education level: Not on file  Occupational History   Occupation: [employer]  Tobacco Use   Smoking status: Never   Smokeless tobacco: Never  Substance and Sexual Activity   Alcohol use: Yes    Comment: socially   Drug use: Never   Sexual activity: Yes  Other Topics Concern   Not  on file  Social History Narrative   Coffee/energy drinks sometimes   Lives with fiance and 4 children   Right handed    Social Determinants of Health   Financial Resource Strain: Not on file  Food Insecurity: Not on file  Transportation Needs: Not on file  Physical Activity: Not on file  Stress: Not on file  Social Connections: Not on file  Intimate Partner Violence: Not on file     PHYSICAL EXAM  Vitals:   06/27/22 0812  BP: (!) 141/96  Pulse: 82  Weight: 271 lb 8 oz (123.2 kg)  Height:  (1.88 m)    Body mass indeMarland Kitchenx is  34.86 kg/m.   General: The patient is well-developed and well-nourished and in no acute distress  HEENT:  Head is [place]/AT.  Sclera are anicteric.   Skin: Extremities are without rash or  edema.  Neurologic Exam  Mental status: The patient is alert and oriented x 3 at the time of the examination. The patient has apparent normal recent and remote memory, with an apparently normal attention span and concentration ability.   Speech is normal.  Cranial nerves: Extraocular movements are full . Color vision was symmetric.  Facial symmetry is present.  Facial strength is normal.  Trapezius and sternocleidomastoid strength is normal. No dysarthria is noted.   No obvious hearing deficits are noted.  Motor:  Muscle bulk is normal.   Tone is normal. Strength is  5 / 5 in all 4 extremities.   Sensory: Sensory testing is intact to pinprick, soft touch and vibration sensation in all 4 extremities.  Coordination: Cerebellar testing reveals good finger-nose-finger and heel-to-shin bilaterally.  Gait and station: Station is normal.   Gait and tandem gait are normal.  The Romberg is negative.  Reflexes: Deep tendon reflexes are symmetric and normal bilaterally.   Plantar responses are flexor.    DIAGNOSTIC DATA (LABS, IMAGING, TESTING) - I reviewed patient records, labs, notes, testing and imaging myself where available.  Lab Results  Component Value Date    WBC 3.3 (L) 12/26/2021   HGB 14.3 12/26/2021   HCT 42.2 12/26/2021   MCV 93 12/26/2021   PLT 206 12/26/2021      Component Value Date/Time   NA 142 06/30/2020 1540   K 4.3 06/30/2020 1540   CL 104 06/30/2020 1540   CO2 25 06/30/2020 1540   GLUCOSE 89 06/30/2020 1540   GLUCOSE 100 (H) 06/02/2019 1144   BUN 12 06/30/2020 1540   CREATININE 1.03 06/30/2020 1540   CALCIUM 9.3 06/30/2020 1540   PROT 6.3 12/26/2021 0825   ALBUMIN 4.3 12/26/2021 0825   AST 45 (H) 12/26/2021 0825   ALT 56 (H) 12/26/2021 0825   ALKPHOS 88 12/26/2021 0825   BILITOT 0.4 12/26/2021 0825   GFRNONAA 111 06/12/2019 1134   GFRAA 128 06/12/2019 1134   Lab Results  Component Value Date   CHOL 196 06/12/2019   HDL 60 06/12/2019   LDLCALC 122 (H) 06/12/2019   TRIG 78 06/12/2019   CHOLHDL 3.3 06/12/2019   Lab Results  Component Value Date   HGBA1C 5.1 12/28/2020   Lab Results  Component Value Date   VITAMINB12 661 06/12/2019   Lab Results  Component Value Date   TSH 1.130 12/28/2020       ASSESSMENT AND PLAN  Multiple sclerosis - Plan: MR BRAIN W WO CONTRAST, CBC with Differential/Platelet, Comprehensive metabolic panel  High risk medication use - Plan: CBC with Differential/Platelet, Comprehensive metabolic panel  Vitamin D deficiency - Plan: VITAMIN D 25 Hydroxy (Vit-D Deficiency, Fractures)   1.   MS appears very stable.    Continue Zeposia.  Check some lab work today for [employer].Marland Kitchen  MRI of the brain to determine if there is any subclinical progression.   2.   Stay active and exercise. 3.    Supplement Vit D.  Recheck to see if higher dose is needed. Return in 6 months or sooner if there are new or worsening neurologic symptoms.  Virga Haltiwanger A. Epimenio Foot, MD, [facility] 06/27/2022, 8:58 AM Certified in Neurology, Clinical Neurophysiology, Sleep Medicine and Neuroimaging  [facility] [address] [place],  [place] [address] [phone]

## 2022-06-28 ENCOUNTER — Other Ambulatory Visit: Payer: Self-pay | Admitting: Neurology

## 2022-06-28 DIAGNOSIS — Z23 Encounter for immunization: Secondary | ICD-10-CM

## 2022-06-28 DIAGNOSIS — G379 Demyelinating disease of central nervous system, unspecified: Secondary | ICD-10-CM

## 2022-06-28 DIAGNOSIS — Z7689 Persons encountering health services in other specified circumstances: Secondary | ICD-10-CM

## 2022-06-28 DIAGNOSIS — R202 Paresthesia of skin: Secondary | ICD-10-CM

## 2022-06-28 DIAGNOSIS — R7309 Other abnormal glucose: Secondary | ICD-10-CM

## 2022-06-28 LAB — COMPREHENSIVE METABOLIC PANEL
ALT: 38 IU/L (ref 0–44)
AST: 23 IU/L (ref 0–40)
Albumin/Globulin Ratio: 2 (ref 1.2–2.2)
Albumin: 4.3 g/dL (ref 4.1–5.1)
Alkaline Phosphatase: 79 IU/L (ref 44–121)
BUN/Creatinine Ratio: 14 (ref 9–20)
BUN: 14 mg/dL (ref 6–20)
Bilirubin Total: 0.3 mg/dL (ref 0.0–1.2)
CO2: 25 mmol/L (ref 20–29)
Calcium: 9.6 mg/dL (ref 8.7–10.2)
Chloride: 103 mmol/L (ref 96–106)
Creatinine, Ser: 1.01 mg/dL (ref 0.76–1.27)
Globulin, Total: 2.1 g/dL (ref 1.5–4.5)
Glucose: 92 mg/dL (ref 70–99)
Potassium: 4.1 mmol/L (ref 3.5–5.2)
Sodium: 140 mmol/L (ref 134–144)
Total Protein: 6.4 g/dL (ref 6.0–8.5)
eGFR: 98 mL/min/{1.73_m2} (ref >59)

## 2022-06-28 LAB — CBC WITH DIFFERENTIAL/PLATELET
Basophils Absolute: 0 10*3/uL (ref 0.0–0.2)
Basos: 1 %
EOS (ABSOLUTE): 0.1 10*3/uL (ref 0.0–0.4)
Eos: 4 %
Hematocrit: 41.6 % (ref 37.5–51.0)
Hemoglobin: 13.9 g/dL (ref 13.0–17.7)
Immature Grans (Abs): 0 10*3/uL (ref 0.0–0.1)
Immature Granulocytes: 0 %
Lymphocytes Absolute: 0.4 10*3/uL — ABNORMAL LOW (ref 0.7–3.1)
Lymphs: 12 %
MCH: 31.7 pg (ref 26.6–33.0)
MCHC: 33.4 g/dL (ref 31.5–35.7)
MCV: 95 fL (ref 79–97)
Monocytes Absolute: 0.5 10*3/uL (ref 0.1–0.9)
Monocytes: 16 %
Neutrophils Absolute: 2.1 10*3/uL (ref 1.4–7.0)
Neutrophils: 67 %
Platelets: 197 10*3/uL (ref 150–450)
RBC: 4.39 x10E6/uL (ref 4.14–5.80)
RDW: 11.8 % (ref 11.6–15.4)
WBC: 3.1 10*3/uL — ABNORMAL LOW (ref 3.4–10.8)

## 2022-06-28 LAB — VITAMIN D 25 HYDROXY (VIT D DEFICIENCY, FRACTURES): Vit D, 25-Hydroxy: 20.8 ng/mL — ABNORMAL LOW (ref 30.0–100.0)

## 2022-06-28 MED ORDER — VITAMIN D (ERGOCALCIFEROL) 1.25 MG (50000 UNIT) PO CAPS: 50000.0000 [IU] | ORAL_CAPSULE | ORAL | 1 refills | Status: AC

## 2022-06-29 ENCOUNTER — Telehealth: Payer: Self-pay | Admitting: Neurology

## 2022-06-29 NOTE — Telephone Encounter (Signed)
VM full, sent mychart message to call me to schedule at [facility].

## 2022-08-02 ENCOUNTER — Ambulatory Visit (INDEPENDENT_AMBULATORY_CARE_PROVIDER_SITE_OTHER): Payer: BC Managed Care – PPO

## 2022-08-02 ENCOUNTER — Other Ambulatory Visit: Payer: Self-pay | Admitting: *Deleted

## 2022-08-02 DIAGNOSIS — G35 Multiple sclerosis: Secondary | ICD-10-CM

## 2022-08-02 MED ORDER — GADOBENATE DIMEGLUMINE 529 MG/ML IV SOLN
20.0000 mL | Freq: Once | INTRAVENOUS | Status: AC | PRN
  Administered 2022-08-02: 20 mL via INTRAVENOUS

## 2022-08-02 MED ORDER — ZEPOSIA 0.92 MG PO CAPS: ORAL_CAPSULE | ORAL | 3 refills | Status: DC

## 2022-08-18 ENCOUNTER — Telehealth: Payer: Self-pay | Admitting: Nurse Practitioner

## 2022-08-18 NOTE — Telephone Encounter (Signed)
Called pt to schedule appt no answer could not leave VM  

## 2023-01-03 ENCOUNTER — Encounter: Payer: Self-pay | Admitting: Neurology

## 2023-01-03 ENCOUNTER — Ambulatory Visit (INDEPENDENT_AMBULATORY_CARE_PROVIDER_SITE_OTHER): Payer: BC Managed Care – PPO | Admitting: Neurology

## 2023-01-03 VITALS — BP 133/92 | HR 71 | Ht 74.0 in | Wt 266.0 lb

## 2023-01-03 DIAGNOSIS — Z79899 Other long term (current) drug therapy: Secondary | ICD-10-CM

## 2023-01-03 DIAGNOSIS — R899 Unspecified abnormal finding in specimens from other organs, systems and tissues: Secondary | ICD-10-CM

## 2023-01-03 DIAGNOSIS — R202 Paresthesia of skin: Secondary | ICD-10-CM

## 2023-01-03 DIAGNOSIS — G43009 Migraine without aura, not intractable, without status migrainosus: Secondary | ICD-10-CM | POA: Diagnosis not present

## 2023-01-03 DIAGNOSIS — G35 Multiple sclerosis: Secondary | ICD-10-CM

## 2023-01-03 NOTE — Progress Notes (Signed)
[facility]  PATIENT: Jacob Wong DOB: [birth year]  REFERRING DOCTOR OR PCP:  Janne Lab, NP SOURCE: Patient, notes from hospital admission, imaging and lab reports, MRI images personally reviewed.  _________________________________   HISTORICAL  CHIEF COMPLAINT:  Chief Complaint  Patient presents with   Room 10    Pt is here with his Son. Pt states when he wakes up he has tightness in both of his hands,but during the day it decreases. Pt states that he is tolerating his Zeposia well. Pt denies and muscle weakness in his legs and arms. Pt denies no extra Fatigue. Pt states that he is doing well.      HISTORY OF PRESENT ILLNESS: Jacob Pressleyis a 37 y.o. man with relapsing remitting  MS.  Update  01/03/2023: He is on Zeposia and doing well.He tolerates it well.   He denies any MS exacerbations or any significant new neurologic symptoms.  Gait is doing well.   He usually will hold the bannister on stairs as balance is mildly off.    Strength and sensation are normal in his limbs.  Sometimes his hands seem swollen when he wakes up.     He has no more episodes of phasic spasms in the left arm and denies any spasticity.  No ataxia.   Bladder function is fine.   Vision is doing well.  He notes some fatigue at times.  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  On his days off he tries to align his schedule with others more.   He is sleeping well now.    He denies any issues with depression, anxiety or cognition.   He has 4 kids age 74-15 .   Migraines are rare - less than one a month .  When 1 occurs, he has nausea, phottphobia and phonophobia.    He takes a Goody's powder or Tylenol with benefit.  Being dehydrated often triggers one.    Vitamin D with low at diagnosis (20.5) he took 50,000 units weekly for 6 months and is now taking OTC supplements   Labs 06/2022 showed lymphocyte count was reduced at 0.4 (on  Zeposia).   LFTs were fine.  MS History (obtained 06/25/2019): He presented to the [facility] emergency room on 06/02/2019 with several days of left sided tingling and cramping.   His hand would cramp into a fist.    He had phasic spasming in the hand for about 15 - 20 seconds, often after standing up.     In the hospital, he had MRI of the brain and cervical spine and an LP.   He received 5 days of IV Solu-Medrol.    He did not improve until a few days after discharge and now feels close to baseline.   In retrospect, he has not had other episodes of neurologic dysfunction.   He was started on Zeposia in April 2021.  Imaging:  MRI brain 08/02/2022 was unchanged.     MRI of the brain 06/02/2019 showed multiple T2/FLAIR hyperintense foci in the hemispheres and right lateral thalamus.  The thalamic/IC focus and 3 other foci enhanced after contrast.  The MRI of the cervical spine showed a normal spinal cord.  There was mild to moderate degenerative changes at C3-C4.     MRI of the thoracic spine 06/04/2019 showed 1 focus anteromedially adjacent to T7  MRI of the brain, cervical spine and thoracic spine 08/04/2020 was  reviewed.  MRI of the brain showed foci in the hemispheres and thalamus consistent with MS.  None of them enhance.  There were no new lesions compared to 2021.  The foci that enhanced in 2021 no longer did so.    MRI of the cervical spine shows a small spot to the left at C3.   That spot was actually present on the earlier MRI as well.Marland Kitchen  MRI of the thoracic spine was normal (a previous MRI had shown a single focus).  Labs:  While in the hospital he had a lumbar puncture performed.  There were 10 oligoclonal bands and the IgG index was markedly elevated to 2.3.  Vitamin D was mildly low.  HIV and the neuromyelitis optica antibody was negative.  He has no FH of MS but does not know all his relatives.      REVIEW OF SYSTEMS: Constitutional: No fevers, chills, sweats, or change in  appetite Eyes: No visual changes, double vision, eye pain Ear, nose and throat: No hearing loss, ear pain, nasal congestion, sore throat Cardiovascular: No chest pain, palpitations Respiratory:  No shortness of breath at rest or with exertion.   No wheezes GastrointestinaI: No nausea, vomiting, diarrhea, abdominal pain, fecal incontinence Genitourinary:  No dysuria, urinary retention or frequency.  No nocturia. Musculoskeletal:  No neck pain, back pain Integumentary: No rash, pruritus, skin lesions Neurological: as above Psychiatric: No depression at this time.  No anxiety Endocrine: No palpitations, diaphoresis, change in appetite, change in weigh or increased thirst Hematologic/Lymphatic:  No anemia, purpura, petechiae. Allergic/Immunologic: No itchy/runny eyes, nasal congestion, recent allergic reactions, rashes  ALLERGIES: Allergies  Allergen Reactions   Amoxicillin Rash    Did it involve swelling of the face/tongue/throat, SOB, or low BP? No Did it involve sudden or severe rash/hives, skin peeling, or any reaction on the inside of your mouth or nose? Yes Did you need to seek medical attention at a hospital or doctor's office? Yes When did it last happen?      Pt was a child If all above answers are "NO", may proceed with cephalosporin use.     HOME MEDICATIONS:  Current Outpatient Medications:    ZEPOSIA 0.92 MG CAPS, MAINTENANCE DOSE: TAKE 1 CAPSULE BY MOUTH 1 TIME A DAY., Disp: 90 capsule, Rfl: 3  PAST MEDICAL HISTORY: History reviewed. No pertinent past medical history.  PAST SURGICAL HISTORY: History reviewed. No pertinent surgical history.  FAMILY HISTORY: Family History  Problem Relation Age of Onset   Hypertension Mother    Hypertension Father    Multiple sclerosis Neg Hx     SOCIAL HISTORY:  Social History   Socioeconomic History   Marital status: Single    Spouse name: Fiance: Regulatory affairs officer   Number of children: 4   Years of education: Some  college   Highest education level: Not on file  Occupational History   Occupation: [employer]  Tobacco Use   Smoking status: Never   Smokeless tobacco: Never  Substance and Sexual Activity   Alcohol use: Yes    Comment: socially   Drug use: Never   Sexual activity: Yes  Other Topics Concern   Not on file  Social History Narrative   Energy drinks sometimes   Lives with fiance and 4 children   Right handed    Social Determinants of Health   Financial Resource Strain: Not on file  Food Insecurity: Not on file  Transportation Needs: Not on file  Physical Activity: Not on  file  Stress: Not on file  Social Connections: Not on file  Intimate Partner Violence: Not on file     PHYSICAL EXAM  Vitals:   01/03/23 0831  BP: (!) 133/92  Pulse: 71  Weight: 266 lb (120.7 kg)  Height: 6\' 2"  (1.88 m)    Body mass index is 34.15 kg/m.   General: The patient is well-developed and well-nourished and in no acute distress  HEENT:  Head is [place]/AT.  Sclera are anicteric.   Skin: Extremities are without rash or  edema.  Neurologic Exam  Mental status: The patient is alert and oriented x 3 at the time of the examination. The patient has apparent normal recent and remote memory, with an apparently normal attention span and concentration ability.   Speech is normal.  Cranial nerves: Extraocular movements are full . Color vision was symmetric.  Facial symmetry is present.  Facial strength is normal.  Trapezius and sternocleidomastoid strength is normal. No dysarthria is noted.   No obvious hearing deficits are noted.  Motor:  Muscle bulk is normal.   Muscle tone is normal. Strength is  5 / 5 in all 4 extremities.   Sensory: Sensory testing is intact to pinprick, soft touch and vibration sensation in all 4 extremities.  Coordination: Cerebellar testing reveals good finger-nose-finger and heel-to-shin bilaterally.  Gait and station: Station is normal.   Gait and tandem gait  are normal.  The Romberg is negative.  Reflexes: Deep tendon reflexes are symmetric and normal bilaterally.       DIAGNOSTIC DATA (LABS, IMAGING, TESTING) - I reviewed patient records, labs, notes, testing and imaging myself where available.  Lab Results  Component Value Date   WBC 3.1 (L) 06/27/2022   HGB 13.9 06/27/2022   HCT 41.6 06/27/2022   MCV 95 06/27/2022   PLT 197 06/27/2022      Component Value Date/Time   NA 140 06/27/2022 0906   K 4.1 06/27/2022 0906   CL 103 06/27/2022 0906   CO2 25 06/27/2022 0906   GLUCOSE 92 06/27/2022 0906   GLUCOSE 100 (H) 06/02/2019 1144   BUN 14 06/27/2022 0906   CREATININE 1.01 06/27/2022 0906   CALCIUM 9.6 06/27/2022 0906   PROT 6.4 06/27/2022 0906   ALBUMIN 4.3 06/27/2022 0906   AST 23 06/27/2022 0906   ALT 38 06/27/2022 0906   ALKPHOS 79 06/27/2022 0906   BILITOT 0.3 06/27/2022 0906   GFRNONAA 111 06/12/2019 1134   GFRAA 128 06/12/2019 1134   Lab Results  Component Value Date   CHOL 196 06/12/2019   HDL 60 06/12/2019   LDLCALC 122 (H) 06/12/2019   TRIG 78 06/12/2019   CHOLHDL 3.3 06/12/2019   Lab Results  Component Value Date   HGBA1C 5.1 12/28/2020   Lab Results  Component Value Date   VITAMINB12 661 06/12/2019   Lab Results  Component Value Date   TSH 1.130 12/28/2020       ASSESSMENT AND PLAN  Multiple sclerosis (HCC) - Plan: CBC with Differential/Platelet, Comprehensive metabolic panel  Migraine without aura and without status migrainosus, not intractable  High risk medication use - Plan: CBC with Differential/Platelet, Comprehensive metabolic panel  Abnormal laboratory test - Plan: CBC with Differential/Platelet, Comprehensive metabolic panel  Arm paresthesia, left   1.   MS is clinically stable.    Continue Zeposia.  Check some lab work today for [employer].Marland Kitchen  recent MRI showed no new lesions (can check again in 2026) 2.   Stay active and  exercise. 3.    Supplement Vit D.   Return in 6 months or  sooner if there are new or worsening neurologic symptoms.  Almir Botts A. Epimenio Foot, MD, [facility] 01/03/2023, 8:51 AM Certified in Neurology, Clinical Neurophysiology, Sleep Medicine and Neuroimaging  [facility] [address], [address] [place], [place] [address] [phone]

## 2023-01-04 LAB — COMPREHENSIVE METABOLIC PANEL
ALT: 32 [IU]/L (ref 0–44)
AST: 22 [IU]/L (ref 0–40)
Albumin: 4.4 g/dL (ref 4.1–5.1)
Alkaline Phosphatase: 79 [IU]/L (ref 44–121)
BUN/Creatinine Ratio: 13 (ref 9–20)
BUN: 11 mg/dL (ref 6–20)
Bilirubin Total: 0.5 mg/dL (ref 0.0–1.2)
CO2: 25 mmol/L (ref 20–29)
Calcium: 9.3 mg/dL (ref 8.7–10.2)
Chloride: 103 mmol/L (ref 96–106)
Creatinine, Ser: 0.85 mg/dL (ref 0.76–1.27)
Globulin, Total: 2.1 g/dL (ref 1.5–4.5)
Glucose: 93 mg/dL (ref 70–99)
Potassium: 4.1 mmol/L (ref 3.5–5.2)
Sodium: 141 mmol/L (ref 134–144)
Total Protein: 6.5 g/dL (ref 6.0–8.5)
eGFR: 114 mL/min/{1.73_m2} (ref >59)

## 2023-01-04 LAB — CBC WITH DIFFERENTIAL/PLATELET
Basophils Absolute: 0 10*3/uL (ref 0.0–0.2)
Basos: 1 %
EOS (ABSOLUTE): 0.2 10*3/uL (ref 0.0–0.4)
Eos: 6 %
Hematocrit: 43.1 % (ref 37.5–51.0)
Hemoglobin: 13.7 g/dL (ref 13.0–17.7)
Immature Grans (Abs): 0 10*3/uL (ref 0.0–0.1)
Immature Granulocytes: 0 %
Lymphocytes Absolute: 0.3 10*3/uL — ABNORMAL LOW (ref 0.7–3.1)
Lymphs: 10 %
MCH: 31.1 pg (ref 26.6–33.0)
MCHC: 31.8 g/dL (ref 31.5–35.7)
MCV: 98 fL — ABNORMAL HIGH (ref 79–97)
Monocytes Absolute: 0.5 10*3/uL (ref 0.1–0.9)
Monocytes: 15 %
Neutrophils Absolute: 2.1 10*3/uL (ref 1.4–7.0)
Neutrophils: 68 %
Platelets: 221 10*3/uL (ref 150–450)
RBC: 4.4 x10E6/uL (ref 4.14–5.80)
RDW: 11.4 % — ABNORMAL LOW (ref 11.6–15.4)
WBC: 3 10*3/uL — ABNORMAL LOW (ref 3.4–10.8)

## 2023-03-03 ENCOUNTER — Other Ambulatory Visit: Payer: Self-pay | Admitting: Neurology

## 2023-03-03 DIAGNOSIS — Z7689 Persons encountering health services in other specified circumstances: Secondary | ICD-10-CM

## 2023-03-03 DIAGNOSIS — R7309 Other abnormal glucose: Secondary | ICD-10-CM

## 2023-03-03 DIAGNOSIS — Z23 Encounter for immunization: Secondary | ICD-10-CM

## 2023-03-03 DIAGNOSIS — G379 Demyelinating disease of central nervous system, unspecified: Secondary | ICD-10-CM

## 2023-03-03 DIAGNOSIS — R202 Paresthesia of skin: Secondary | ICD-10-CM

## 2023-05-23 ENCOUNTER — Telehealth: Payer: Self-pay | Admitting: Pharmacy Technician

## 2023-05-23 NOTE — Telephone Encounter (Signed)
 Pharmacy Patient Advocate Encounter   Received notification from Fax that prior authorization for Zeposia 0.92 mg is required/requested.   Insurance verification completed.   The patient is insured through [facility] .   Per test claim: PA required; PA submitted to above mentioned insurance via Fax Key/confirmation #/[id number] Status is pending

## 2023-07-19 ENCOUNTER — Encounter: Payer: Self-pay | Admitting: Neurology

## 2023-07-19 ENCOUNTER — Ambulatory Visit (INDEPENDENT_AMBULATORY_CARE_PROVIDER_SITE_OTHER): Payer: BC Managed Care – PPO | Admitting: Neurology

## 2023-07-19 VITALS — BP 115/80 | HR 82 | Ht 74.0 in | Wt 264.4 lb

## 2023-07-19 DIAGNOSIS — G43009 Migraine without aura, not intractable, without status migrainosus: Secondary | ICD-10-CM | POA: Diagnosis not present

## 2023-07-19 DIAGNOSIS — G35 Multiple sclerosis: Secondary | ICD-10-CM

## 2023-07-19 DIAGNOSIS — Z79899 Other long term (current) drug therapy: Secondary | ICD-10-CM

## 2023-07-19 NOTE — Progress Notes (Signed)
 [facility]  PATIENT: Jacob Wong DOB: [birth year]  REFERRING DOCTOR OR PCP:  Foster Im, NP SOURCE: Patient, notes from hospital admission, imaging and lab reports, MRI images personally reviewed.  _________________________________   HISTORICAL  CHIEF COMPLAINT:  Chief Complaint  Patient presents with   Follow-up    Pt in room 11. Alone. Here for MS follow up. On Zeposia .  Pt reports doing well, last eye exam was last month normal visit.     HISTORY OF PRESENT ILLNESS: Jacob Pressleyis a 38 y.o. man with relapsing remitting  MS.  Update  07/19/2023: He is on Zeposia  and doing well.He tolerates it well.   He denies any MS exacerbations or any significant new neurologic symptoms.    Lab work 06/27/2023 showed lymphocytes were 0.3  Gait is doing well.   He usually will hold the bannister on stairs as balance is mildly off.    Strength and sensation are normal in his limbs.    He has no more episodes of phasic spasms in the left arm and denies any spasticity.  No ataxia.   Bladder function is fine.   Vision is doing well.  He stays active but not exercising much.  He notes some fatigue at times.  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  On his days off he tries to align his schedule with others more.   He is sleeping well now.    He denies any issues with depression, anxiety or cognition.   He has 4 kids.    .   Migraines are rare - less than one a month .  When 1 occurs, he has nausea, phottphobia and phonophobia.    He takes a Goody's powder or Tylenol  with benefit.  Being dehydrated often triggers one.    Vitamin D  with low at diagnosis (20.5) he took 50,000 units weekly for 6 months and is now taking OTC supplements   MS History (obtained 06/25/2019): He presented to the [facility] emergency room on 06/02/2019 with several days of left sided tingling and cramping.   His hand would cramp into a fist.    He had  phasic spasming in the hand for about 15 - 20 seconds, often after standing up.     In the hospital, he had MRI of the brain and cervical spine and an LP.   He received 5 days of IV Solu-Medrol .    He did not improve until a few days after discharge and now feels close to baseline.   In retrospect, he has not had other episodes of neurologic dysfunction.   He was started on Zeposia  in April 2021.  Imaging:  MRI brain 08/02/2022 was unchanged.     MRI of the brain 06/02/2019 showed multiple T2/FLAIR hyperintense foci in the hemispheres and right lateral thalamus.  The thalamic/IC focus and 3 other foci enhanced after contrast.  The MRI of the cervical spine showed a normal spinal cord.  There was mild to moderate degenerative changes at C3-C4.     MRI of the thoracic spine 06/04/2019 showed 1 focus anteromedially adjacent to T7  MRI of the brain, cervical spine and thoracic spine 08/04/2020 was reviewed.  MRI of the brain showed foci in the hemispheres and thalamus consistent with MS.  None of them enhance.  There were no new lesions compared to 2021.  The foci that enhanced in 2021 no longer did so.  MRI of the cervical spine shows a small spot to the left at C3.   That spot was actually present on the earlier MRI as well.Aaron Aas  MRI of the thoracic spine was normal (a previous MRI had shown a single focus).  Labs:  While in the hospital he had a lumbar puncture performed.  There were 10 oligoclonal bands and the IgG index was markedly elevated to 2.3.  Vitamin D  was mildly low.  HIV and the neuromyelitis optica antibody was negative.  He has no FH of MS but does not know all his relatives.      REVIEW OF SYSTEMS: Constitutional: No fevers, chills, sweats, or change in appetite Eyes: No visual changes, double vision, eye pain Ear, nose and throat: No hearing loss, ear pain, nasal congestion, sore throat Cardiovascular: No chest pain, palpitations Respiratory:  No shortness of breath at rest or with  exertion.   No wheezes GastrointestinaI: No nausea, vomiting, diarrhea, abdominal pain, fecal incontinence Genitourinary:  No dysuria, urinary retention or frequency.  No nocturia. Musculoskeletal:  No neck pain, back pain Integumentary: No rash, pruritus, skin lesions Neurological: as above Psychiatric: No depression at this time.  No anxiety Endocrine: No palpitations, diaphoresis, change in appetite, change in weigh or increased thirst Hematologic/Lymphatic:  No anemia, purpura, petechiae. Allergic/Immunologic: No itchy/runny eyes, nasal congestion, recent allergic reactions, rashes  ALLERGIES: Allergies  Allergen Reactions   Amoxicillin Rash    Did it involve swelling of the face/tongue/throat, SOB, or low BP? No Did it involve sudden or severe rash/hives, skin peeling, or any reaction on the inside of your mouth or nose? Yes Did you need to seek medical attention at a hospital or doctor's office? Yes When did it last happen?      Pt was a child If all above answers are "NO", may proceed with cephalosporin use.     HOME MEDICATIONS:  Current Outpatient Medications:    ZEPOSIA  0.92 MG CAPS, MAINTENANCE DOSE: TAKE 1 CAPSULE BY MOUTH 1 TIME A DAY., Disp: 90 capsule, Rfl: 3  PAST MEDICAL HISTORY: History reviewed. No pertinent past medical history.  PAST SURGICAL HISTORY: History reviewed. No pertinent surgical history.  FAMILY HISTORY: Family History  Problem Relation Age of Onset   Hypertension Mother    Hypertension Father    Multiple sclerosis Neg Hx     SOCIAL HISTORY:  Social History   Socioeconomic History   Marital status: Single    Spouse name: Fiance: Regulatory affairs officer   Number of children: 4   Years of education: Some college   Highest education level: Not on file  Occupational History   Occupation: [employer]  Tobacco Use   Smoking status: Never   Smokeless tobacco: Never  Vaping Use   Vaping status: Never Used  Substance and Sexual  Activity   Alcohol use: Yes    Comment: socially   Drug use: Never   Sexual activity: Yes  Other Topics Concern   Not on file  Social History Narrative   Energy drinks sometimes   Lives with fiance and 4 children   Right handed    Social Drivers of Health   Financial Resource Strain: Not on file  Food Insecurity: Low Risk  (06/27/2023)   Received from [facility]   Hunger Vital Sign    Worried About Running Out of Food in the Last Year: Never true    Ran Out of Food in the Last Year: Never true  Transportation Needs: No Transportation Needs (  06/27/2023)   Received from [facility]   Transportation    In the past 12 months, has lack of reliable transportation kept you from medical appointments, meetings, work or from getting things needed for daily living? : No  Physical Activity: Not on file  Stress: Not on file  Social Connections: Not on file  Intimate Partner Violence: Not on file     PHYSICAL EXAM  Vitals:   07/19/23 1003  BP: 115/80  Pulse: 82  SpO2: 96%  Weight: 264 lb 6.4 oz (119.9 kg)  Height: 6\' 2"  (1.88 m)    Body mass index is 33.95 kg/m.   General: The patient is well-developed and well-nourished and in no acute distress  HEENT:  Head is [place]/AT.  Sclera are anicteric.   Skin: Extremities are without rash or  edema.  Neurologic Exam  Mental status: The patient is alert and oriented x 3 at the time of the examination. The patient has apparent normal recent and remote memory, with an apparently normal attention span and concentration ability.   Speech is normal.  Cranial nerves: Extraocular movements are full . Color vision was symmetric.  Facial symmetry is present.  Facial strength is normal.  Trapezius and sternocleidomastoid strength is normal. No dysarthria is noted.   No obvious hearing deficits are noted.  Motor:  Muscle bulk is normal.   Muscle tone is normal. Strength is  5 / 5 in all 4 extremities.   Sensory: Sensory testing is intact to  pinprick, soft touch and vibration sensation in all 4 extremities.  Coordination: Cerebellar testing reveals good finger-nose-finger and heel-to-shin bilaterally.  Gait and station: Station is normal.   Gait and tandem gait are normal.  The Romberg is negative.  Reflexes: Deep tendon reflexes are symmetric and normal bilaterally.       DIAGNOSTIC DATA (LABS, IMAGING, TESTING) - I reviewed patient records, labs, notes, testing and imaging myself where available.  Lab Results  Component Value Date   WBC 3.0 (L) 01/03/2023   HGB 13.7 01/03/2023   HCT 43.1 01/03/2023   MCV 98 (H) 01/03/2023   PLT 221 01/03/2023      Component Value Date/Time   NA 141 01/03/2023 0900   K 4.1 01/03/2023 0900   CL 103 01/03/2023 0900   CO2 25 01/03/2023 0900   GLUCOSE 93 01/03/2023 0900   GLUCOSE 100 (H) 06/02/2019 1144   BUN 11 01/03/2023 0900   CREATININE 0.85 01/03/2023 0900   CALCIUM 9.3 01/03/2023 0900   PROT 6.5 01/03/2023 0900   ALBUMIN 4.4 01/03/2023 0900   AST 22 01/03/2023 0900   ALT 32 01/03/2023 0900   ALKPHOS 79 01/03/2023 0900   BILITOT 0.5 01/03/2023 0900   GFRNONAA 111 06/12/2019 1134   GFRAA 128 06/12/2019 1134   Lab Results  Component Value Date   CHOL 196 06/12/2019   HDL 60 06/12/2019   LDLCALC 122 (H) 06/12/2019   TRIG 78 06/12/2019   CHOLHDL 3.3 06/12/2019   Lab Results  Component Value Date   HGBA1C 5.1 12/28/2020   Lab Results  Component Value Date   VITAMINB12 661 06/12/2019   Lab Results  Component Value Date   TSH 1.130 12/28/2020       ASSESSMENT AND PLAN  Multiple sclerosis (HCC)  Migraine without aura and without status migrainosus, not intractable  High risk medication use   1.   His MS is clinically stable.    Continue Zeposia .  2024 MRI showed no new lesions (  can check again in 2026) 2.   Stay active and exercise. 3.   Supplement Vit D.   Return in 6 months or sooner if there are new or worsening neurologic symptoms.  Akari Defelice A.  Godwin Lat, MD, [facility] 07/19/2023, 10:28 AM Certified in Neurology, Clinical Neurophysiology, Sleep Medicine and Neuroimaging  [facility] [address] [place], [place] [address] [phone]

## 2023-08-01 ENCOUNTER — Other Ambulatory Visit: Payer: Self-pay | Admitting: Neurology

## 2023-08-01 DIAGNOSIS — G35 Multiple sclerosis: Secondary | ICD-10-CM

## 2023-08-01 DIAGNOSIS — G35D Multiple sclerosis, unspecified: Secondary | ICD-10-CM

## 2023-08-01 NOTE — Telephone Encounter (Signed)
 Last seen on 07/19/23 Follow up scheduled on 02/27/24

## 2023-10-03 ENCOUNTER — Encounter: Payer: Self-pay | Admitting: *Deleted

## 2023-10-03 ENCOUNTER — Encounter: Payer: Self-pay | Admitting: Neurology

## 2024-01-18 ENCOUNTER — Ambulatory Visit
Admission: RE | Admit: 2024-01-18 | Discharge: 2024-01-18 | Disposition: A | Discharge status: Home / Self Care | Source: Ambulatory Visit | Attending: Family Medicine | Admitting: Family Medicine

## 2024-01-18 VITALS — BP 130/81 | HR 84 | Temp 98.1°F | Resp 20 | Ht 74.0 in | Wt 265.0 lb

## 2024-01-18 DIAGNOSIS — L03012 Cellulitis of left finger: Secondary | ICD-10-CM | POA: Diagnosis not present

## 2024-01-18 HISTORY — DX: Myoneural disorder, unspecified: G70.9

## 2024-01-18 MED ORDER — MUPIROCIN 2 % EX OINT: 1.0000 | TOPICAL_OINTMENT | Freq: Two times a day (BID) | CUTANEOUS | 0 refills | Status: AC

## 2024-01-18 MED ORDER — CEPHALEXIN 500 MG PO CAPS: 500.0000 mg | ORAL_CAPSULE | Freq: Two times a day (BID) | ORAL | 0 refills | Status: AC

## 2024-01-18 NOTE — ED Provider Notes (Signed)
 [facility]    CSN: [id number] Arrival date & time: 01/18/24  1258      History   Chief Complaint Chief Complaint  Patient presents with   Nail Problem    HPI Jacob Wong is a 38 y.o. male.   HPI Here for swelling and pain in his distal left middle finger.  It has been bothering him for about 2 days.  No fever or chills  He is allergic to amoxicillin which causes a rash   Past Medical History:  Diagnosis Date   Arm paresthesia, left 06/03/2019   Neuromuscular disorder (HCC) 06/02/2019    Patient Active Problem List   Diagnosis Date Noted   Migraine without aura and without status migrainosus, not intractable 09/24/2019   Encounter for completion of form with patient 07/17/2019   Multiple sclerosis 06/25/2019   High risk medication use 06/25/2019   Demyelinating disease (HCC) 06/02/2019    History reviewed. No pertinent surgical history.     Home Medications    Prior to Admission medications   Medication Sig Start Date End Date Taking? Authorizing Provider  cephALEXin (KEFLEX) 500 MG capsule Take 1 capsule (500 mg total) by mouth 2 (two) times daily for 7 days. 01/18/24 01/25/24 Yes Media Pizzini K, MD  mupirocin ointment (BACTROBAN) 2 % Apply 1 Application topically 2 (two) times daily. To affected area till better 01/18/24  Yes Chante Mayson, Sharlet POUR, MD  ZEPOSIA  0.92 MG CAPS TAKE 1 CAPSULE BY MOUTH 1 TIME A DAY 08/01/23  Yes Sater, Charlie LABOR, MD  semaglutide-weight management (WEGOVY) 0.25 MG/0.5ML SOAJ SQ injection Inject 0.25 mg into the skin. 06/27/23   [provider]    Family History Family History  Problem Relation Age of Onset   Hypertension Mother    Hypertension Father    Multiple sclerosis Neg Hx     Social History Social History   Tobacco Use   Smoking status: Never   Smokeless tobacco: Never  Vaping Use   Vaping status: Never Used  Substance Use Topics   Alcohol use: Yes    Comment: socially   Drug use: Never      Allergies   Amoxicillin   Review of Systems Review of Systems   Physical Exam Triage Vital Signs ED Triage Vitals  Encounter Vitals Group     BP 01/18/24 1332 130/81     Girls Systolic BP Percentile --      Girls Diastolic BP Percentile --      Boys Systolic BP Percentile --      Boys Diastolic BP Percentile --      Pulse Rate 01/18/24 1332 84     Resp 01/18/24 1332 20     Temp 01/18/24 1332 98.1 F (36.7 C)     Temp Source 01/18/24 1332 Oral     SpO2 01/18/24 1332 98 %     Weight 01/18/24 1330 265 lb (120.2 kg)     Height 01/18/24 1330 6' 2 (1.88 m)     Head Circumference --      Peak Flow --      Pain Score 01/18/24 1330 1     Pain Loc --      Pain Education --      Exclude from Growth Chart --    No data found.  Updated Vital Signs BP 130/81 (BP Location: Left Arm)   Pulse 84   Temp 98.1 F (36.7 C) (Oral)   Resp 20   Ht 6' 2 (1.88  m)   Wt 120.2 kg   SpO2 98%   BMI 34.02 kg/m   Visual Acuity Right Eye Distance:   Left Eye Distance:   Bilateral Distance:    Right Eye Near:   Left Eye Near:    Bilateral Near:     Physical Exam Vitals reviewed.  Constitutional:      General: He is not in acute distress.    Appearance: He is not toxic-appearing.  Skin:    Coloration: Skin is not jaundiced or pale.     Comments: There is some erythema and some swelling of the distal left middle finger.  There is a semilunar yellow area about 5 mm along the proximal nail fold on the medial aspect.  Neurological:     Mental Status: He is alert and oriented to person, place, and time.  Psychiatric:        Behavior: Behavior normal.      UC Treatments / Results  Labs (all labs ordered are listed, but only abnormal results are displayed) Labs Reviewed - No data to display  EKG   Radiology No results found.  Procedures Procedures (including critical care time)  Medications Ordered in UC Medications - No data to display  Initial Impression /  Assessment and Plan / UC Course  I have reviewed the triage vital signs and the nursing notes.  Pertinent labs & imaging results that were available during my care of the patient were reviewed by me and considered in my medical decision making (see chart for details).     We discussed risks and benefits of drainage procedure and he gives verbal consent.  Under clean conditions, Pain-eze is used for topical anesthesia.  An 18-gauge needle is used to make a tiny stab wound underneath the proximal nail fold on the left middle finger.  A small dot of purulent material was obtained.  Bandage is applied  Keflex is sent in for the infection and mupirocin is sent in to apply topically.  We discussed wound care. Final Clinical Impressions(s) / UC Diagnoses   Final diagnoses:  Paronychia of finger of left hand     Discharge Instructions      Cephalexin 500 mg --1 tablet by mouth 2 times daily for 7 days.  Put mupirocin ointment on the sore area twice daily until improved      ED Prescriptions     Medication Sig Dispense Auth. Provider   cephALEXin (KEFLEX) 500 MG capsule Take 1 capsule (500 mg total) by mouth 2 (two) times daily for 7 days. 14 capsule Mairin Lindsley K, MD   mupirocin ointment (BACTROBAN) 2 % Apply 1 Application topically 2 (two) times daily. To affected area till better 22 g Vonna Sharlet POUR, MD      PDMP not reviewed this encounter.   Vonna Sharlet POUR, MD 01/18/24 [phone]

## 2024-01-18 NOTE — ED Triage Notes (Signed)
 Finger - Entered by patient (Appt 1300)  Patient reports swelling around base of nail on left (middle finger). No fever. First noticed about 2 days ago.

## 2024-01-18 NOTE — Discharge Instructions (Signed)
 Cephalexin 500 mg --1 tablet by mouth 2 times daily for 7 days.  Put mupirocin ointment on the sore area twice daily until improved

## 2024-02-27 ENCOUNTER — Encounter: Payer: Self-pay | Admitting: Neurology

## 2024-02-27 ENCOUNTER — Ambulatory Visit: Admitting: Neurology

## 2024-02-27 VITALS — BP 137/91 | HR 86 | Ht 74.0 in | Wt 271.5 lb

## 2024-02-27 DIAGNOSIS — R202 Paresthesia of skin: Secondary | ICD-10-CM | POA: Diagnosis not present

## 2024-02-27 DIAGNOSIS — Z79899 Other long term (current) drug therapy: Secondary | ICD-10-CM

## 2024-02-27 DIAGNOSIS — G35A Relapsing-remitting multiple sclerosis: Secondary | ICD-10-CM

## 2024-02-27 DIAGNOSIS — E559 Vitamin D deficiency, unspecified: Secondary | ICD-10-CM

## 2024-02-27 DIAGNOSIS — G43009 Migraine without aura, not intractable, without status migrainosus: Secondary | ICD-10-CM | POA: Diagnosis not present

## 2024-02-27 NOTE — Progress Notes (Signed)
 [facility]  PATIENT: Jacob Wong DOB: [birth year]  REFERRING DOCTOR OR PCP:  Jacob Gainer, NP SOURCE: Patient, notes from hospital admission, imaging and lab reports, MRI images personally reviewed.  _________________________________   HISTORICAL  CHIEF COMPLAINT:  Chief Complaint  Patient presents with   RM10/MS    Pt is here Alone. Pt states that things have been going good since last appointment.     HISTORY OF PRESENT ILLNESS: Jacob Pressleyis a 38 y.o. man with relapsing remitting  MS.  Update  02/27/2024: He is on Zeposia  and doing well.He tolerates it well.   He denies any MS exacerbations or any significant new neurologic symptoms.    Lab work 06/27/2023 showed lymphocytes were 0.3  Gait is doing well.   He usually will hold the bannister on stairs as balance is mildly off.    Strength and sensation are normal in his limbs.   The phasic spasms in the left arm have resolved. No tonic spasticity.  No ataxia.   Bladder function is fine.   Vision is doing well.  He stays active but not exercising much.  He notes some fatigue at times.  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  (one two, off two, on 3 off 2).  On his days off he tries to align his schedule with others more.   He is sleeping well now - occasional has difficulty with sleep onset due to shift work.    He denies any issues with depression, anxiety or cognition.   He has 4 kids.    .   Migraines are rare - still occurring less than one a month .  When 1 occurs, he has nausea, phottphobia and phonophobia.    He takes a Goody's powder or Tylenol  with benefit.  Being dehydrated often triggers one.    Vitamin D  with low at diagnosis (20.5) he took 50,000 units weekly for 6 months and is now taking OTC supplements   He was unable to get [facility]. Covered for weight loss   MS History (obtained 06/25/2019): [facility] to [facility] room on 06/02/2019  with several days of left sided tingling and cramping.   His hand would cramp into a fist.    He had phasic spasming in the hand for about 15 - 20 seconds, often after standing up.     In the hospital, he had MRI of the brain and cervical spine and an LP.   He received 5 days of IV Solu-Medrol .    He did not improve until a few days after discharge and now feels close to baseline.   In retrospect, he has not had other episodes of neurologic dysfunction.   He was started on Zeposia  in April 2021.  Imaging:  MRI brain 08/02/2022 was unchanged.     MRI of the brain 06/02/2019 showed multiple T2/FLAIR hyperintense foci in the hemispheres and right lateral thalamus.  The thalamic/IC focus and 3 other foci enhanced after contrast.  The MRI of the cervical spine showed a normal spinal cord.  There was mild to moderate degenerative changes at C3-C4.     MRI of the thoracic spine 06/04/2019 showed 1 focus anteromedially adjacent to T7  MRI of the brain, cervical spine and thoracic spine 08/04/2020 was reviewed.  MRI of the brain showed foci in the hemispheres and thalamus consistent with MS.  None of them enhance.  There were no new lesions  compared to 2021.  The foci that enhanced in 2021 no longer did so.    MRI of the cervical spine shows a small spot to the left at C3.   That spot was actually present on the earlier MRI as well.SABRA  MRI of the thoracic spine was normal (a previous MRI had shown a single focus).  Labs:  While in the hospital he had a lumbar puncture performed.  There were 10 oligoclonal bands and the IgG index was markedly elevated to 2.3.  Vitamin D  was mildly low.  HIV and the neuromyelitis optica antibody was negative.  He has no FH of MS but does not know all his relatives.      REVIEW OF SYSTEMS: Constitutional: No fevers, chills, sweats, or change in appetite Eyes: No visual changes, double vision, eye pain Ear, nose and throat: No hearing loss, ear pain, nasal congestion, sore  throat Cardiovascular: No chest pain, palpitations Respiratory:  No shortness of breath at rest or with exertion.   No wheezes GastrointestinaI: No nausea, vomiting, diarrhea, abdominal pain, fecal incontinence Genitourinary:  No dysuria, urinary retention or frequency.  No nocturia. Musculoskeletal:  No neck pain, back pain Integumentary: No rash, pruritus, skin lesions Neurological: as above Psychiatric: No depression at this time.  No anxiety Endocrine: No palpitations, diaphoresis, change in appetite, change in weigh or increased thirst Hematologic/Lymphatic:  No anemia, purpura, petechiae. Allergic/Immunologic: No itchy/runny eyes, nasal congestion, recent allergic reactions, rashes  ALLERGIES: Allergies  Allergen Reactions   Amoxicillin Rash and Hives    Did it involve swelling of the face/tongue/throat, SOB, or low BP? No  Did it involve sudden or severe rash/hives, skin peeling, or any reaction on the inside of your mouth or nose? Yes  Did you need to seek medical attention at a hospital or doctor's office? Yes  When did it last happen?      Pt was a child  If all above answers are NO, may proceed with cephalosporin use.    HOME MEDICATIONS:  Current Outpatient Medications:    ZEPOSIA  0.92 MG CAPS, TAKE 1 CAPSULE BY MOUTH 1 TIME A DAY, Disp: 90 capsule, Rfl: 3   mupirocin  ointment (BACTROBAN ) 2 %, Apply 1 Application topically 2 (two) times daily. To affected area till better (Patient not taking: Reported on 02/27/2024), Disp: 22 g, Rfl: 0   semaglutide-weight management (WEGOVY) 0.25 MG/0.5ML SOAJ SQ injection, Inject 0.25 mg into the skin. (Patient not taking: Reported on 02/27/2024), Disp: , Rfl:   PAST MEDICAL HISTORY: Past Medical History:  Diagnosis Date   Arm paresthesia, left 06/03/2019   Neuromuscular disorder (HCC) 06/02/2019    PAST SURGICAL HISTORY: History reviewed. No pertinent surgical history.  FAMILY HISTORY: Family History  Problem Relation  Age of Onset   Hypertension Mother    Hypertension Father    Multiple sclerosis Neg Hx     SOCIAL HISTORY:  Social History   Socioeconomic History   Marital status: Single    Spouse name: Fiance: Regulatory Affairs Officer   Number of children: 4   Years of education: Some college   Highest education level: Not on file  Occupational History   Occupation: [employer]  Tobacco Use   Smoking status: Never   Smokeless tobacco: Never  Vaping Use   Vaping status: Never Used  Substance and Sexual Activity   Alcohol use: Yes    Comment: socially   Drug use: Never   Sexual activity: Yes  Other Topics Concern   Not  on file  Social History Narrative   Energy drinks sometimes   Lives with fiance and 4 children   Right handed    Social Drivers of Health   Tobacco Use: Low Risk (02/27/2024)   Patient History    Smoking Tobacco Use: Never    Smokeless Tobacco Use: Never    Passive Exposure: Not on file  Financial Resource Strain: Not on file  Food Insecurity: Low Risk (06/27/2023)   Received from [facility]   Epic    Within the past 12 months, you worried that your food would run out before you got money to buy more: Never true    Within the past 12 months, the food you bought just didn't last and you didn't have money to get more. : Never true  Transportation Needs: No Transportation Needs (06/27/2023)   Received from [facility]    In the past 12 months, has lack of reliable transportation kept you from medical appointments, meetings, work or from getting things needed for daily living? : No  Physical Activity: Not on file  Stress: Not on file  Social Connections: Not on file  Intimate Partner Violence: Not on file  Depression (EYV7-0): Not on file  Alcohol Screen: Not on file  Housing: Low Risk (06/27/2023)   Received from [facility]   Epic    What is your living situation today?: I have a steady place to live    Think about the place you live.  Do you have problems with any of the following? Choose all that apply:: None/None on this list  Utilities: Low Risk (06/27/2023)   Received from [facility]   Utilities    In the past 12 months has the electric, gas, oil, or water company threatened to shut off services in your home? : No  Health Literacy: Not on file     PHYSICAL EXAM  Vitals:   02/27/24 0823  BP: (!) 137/91  Pulse: 86  SpO2: 97%  Weight: 271 lb 8 oz (123.2 kg)  Height: 6' 2 (1.88 m)    Body mass index is 34.86 kg/m.   General: The patient is well-developed and well-nourished and in no acute distress  HEENT:  Head is [place]/AT.  Sclera are anicteric.   Skin: Extremities are without rash or  edema.  Neurologic Exam  Mental status: The patient is alert and oriented x 3 at the time of the examination. The patient has apparent normal recent and remote memory, with an apparently normal attention span and concentration ability.   Speech is normal.  Cranial nerves: Extraocular movements are full . Color vision was symmetric.  Facial symmetry is present.  Facial strength is normal.  Trapezius and sternocleidomastoid strength is normal. No dysarthria is noted.   No obvious hearing deficits are noted.  Motor:  Muscle bulk is normal.   Muscle tone is normal. Strength is  5 / 5 in all 4 extremities.   Sensory: Sensory testing is intact to pinprick, soft touch and vibration sensation in all 4 extremities.  Coordination: Cerebellar testing reveals good finger-nose-finger and heel-to-shin bilaterally.  Gait and station: Station is normal.  Gait and tandem gait are normal.  No Romberg sign.    Reflexes: Deep tendon reflexes are symmetric and normal bilaterally.        DIAGNOSTIC DATA (LABS, IMAGING, TESTING) - I reviewed patient records, labs, notes, testing and imaging myself where available.  Lab Results  Component Value Date   WBC  3.0 (L) 01/03/2023   HGB 13.7 01/03/2023   HCT 43.1 01/03/2023   MCV 98 (H)  01/03/2023   PLT 221 01/03/2023      Component Value Date/Time   NA 141 01/03/2023 0900   K 4.1 01/03/2023 0900   CL 103 01/03/2023 0900   CO2 25 01/03/2023 0900   GLUCOSE 93 01/03/2023 0900   GLUCOSE 100 (H) 06/02/2019 1144   BUN 11 01/03/2023 0900   CREATININE 0.85 01/03/2023 0900   CALCIUM 9.3 01/03/2023 0900   PROT 6.5 01/03/2023 0900   ALBUMIN 4.4 01/03/2023 0900   AST 22 01/03/2023 0900   ALT 32 01/03/2023 0900   ALKPHOS 79 01/03/2023 0900   BILITOT 0.5 01/03/2023 0900   GFRNONAA 111 06/12/2019 1134   GFRAA 128 06/12/2019 1134   Lab Results  Component Value Date   CHOL 196 06/12/2019   HDL 60 06/12/2019   LDLCALC 122 (H) 06/12/2019   TRIG 78 06/12/2019   CHOLHDL 3.3 06/12/2019   Lab Results  Component Value Date   HGBA1C 5.1 12/28/2020   Lab Results  Component Value Date   VITAMINB12 661 06/12/2019   Lab Results  Component Value Date   TSH 1.130 12/28/2020       ASSESSMENT AND PLAN  Multiple sclerosis, relapsing-remitting - Plan: CBC with Differential/Platelets, MR BRAIN W WO CONTRAST  Migraine without aura and without status migrainosus, not intractable  High risk medication use - Plan: CBC with Differential/Platelets  Arm paresthesia, left - Plan: MR BRAIN W WO CONTRAST  Vitamin D  deficiency   1.   His MS is clinically stable.    Continue Zeposia .  2024 MRI showed no new lesions. We will check another MRI brain to determine if there is any breakthrough activity.  If this is occurring we need to consider different disease modifying therapy.   2.   Stay active and exercise. 3.   Supplement Vit D.   Return in 6 months or sooner if there are new or worsening neurologic symptoms.  Crystol Walpole A. Vear, MD, [facility] 02/27/2024, 8:54 AM Certified in Neurology, Clinical Neurophysiology, Sleep Medicine and Neuroimaging  [facility] [address], [address] [place], [place] [address] [phone]

## 2024-02-28 LAB — CBC WITH DIFFERENTIAL/PLATELET
Basophils Absolute: 0 x10E3/uL (ref 0.0–0.2)
Basos: 0 %
EOS (ABSOLUTE): 0.1 x10E3/uL (ref 0.0–0.4)
Eos: 3 %
Hematocrit: 42.4 % (ref 37.5–51.0)
Hemoglobin: 13.9 g/dL (ref 13.0–17.7)
Immature Grans (Abs): 0 x10E3/uL (ref 0.0–0.1)
Immature Granulocytes: 0 %
Lymphocytes Absolute: 0.4 x10E3/uL — ABNORMAL LOW (ref 0.7–3.1)
Lymphs: 11 %
MCH: 31.7 pg (ref 26.6–33.0)
MCHC: 32.8 g/dL (ref 31.5–35.7)
MCV: 97 fL (ref 79–97)
Monocytes Absolute: 0.5 x10E3/uL (ref 0.1–0.9)
Monocytes: 14 %
Neutrophils Absolute: 2.4 x10E3/uL (ref 1.4–7.0)
Neutrophils: 72 %
Platelets: 218 x10E3/uL (ref 150–450)
RBC: 4.39 x10E6/uL (ref 4.14–5.80)
RDW: 12 % (ref 11.6–15.4)
WBC: 3.4 x10E3/uL (ref 3.4–10.8)

## 2024-03-12 ENCOUNTER — Ambulatory Visit

## 2024-03-12 DIAGNOSIS — G35A Relapsing-remitting multiple sclerosis: Secondary | ICD-10-CM

## 2024-03-12 DIAGNOSIS — R202 Paresthesia of skin: Secondary | ICD-10-CM | POA: Diagnosis not present

## 2024-03-12 MED ORDER — GADOBENATE DIMEGLUMINE 529 MG/ML IV SOLN
20.0000 mL | Freq: Once | INTRAVENOUS | Status: AC | PRN
  Administered 2024-03-12: 20 mL via INTRAVENOUS

## 2024-05-14 ENCOUNTER — Encounter: Admitting: Nurse Practitioner

## 2024-10-22 ENCOUNTER — Ambulatory Visit: Admitting: Neurology
